# Patient Record
Sex: Female | Born: 1970 | Race: Black or African American | Hispanic: No | Marital: Single | Smoking: Never smoker
Health system: Southern US, Community
[De-identification: ages and names within clinical notes are randomized; demographics above are authoritative.]

## PROBLEM LIST (undated history)

## (undated) DIAGNOSIS — F419 Anxiety disorder, unspecified: Secondary | ICD-10-CM

## (undated) DIAGNOSIS — D649 Anemia, unspecified: Secondary | ICD-10-CM

## (undated) DIAGNOSIS — D569 Thalassemia, unspecified: Secondary | ICD-10-CM

## (undated) HISTORY — DX: Anxiety disorder, unspecified: F41.9

## (undated) HISTORY — DX: Thalassemia, unspecified: D56.9

## (undated) HISTORY — DX: Anemia, unspecified: D64.9

## (undated) HISTORY — PX: NO PAST SURGERIES: SHX2092

---

## 2000-03-28 ENCOUNTER — Emergency Department (HOSPITAL_COMMUNITY): Admission: EM | Admit: 2000-03-28 | Discharge: 2000-03-28 | Payer: Self-pay | Admitting: Emergency Medicine

## 2000-03-28 ENCOUNTER — Encounter: Payer: Self-pay | Admitting: Emergency Medicine

## 2001-07-05 ENCOUNTER — Other Ambulatory Visit: Admission: RE | Admit: 2001-07-05 | Discharge: 2001-07-05 | Payer: Self-pay | Admitting: Family Medicine

## 2012-05-29 ENCOUNTER — Other Ambulatory Visit: Payer: Self-pay | Admitting: Internal Medicine

## 2012-05-29 DIAGNOSIS — Z1231 Encounter for screening mammogram for malignant neoplasm of breast: Secondary | ICD-10-CM

## 2012-06-01 ENCOUNTER — Ambulatory Visit
Admission: RE | Admit: 2012-06-01 | Discharge: 2012-06-01 | Disposition: A | Discharge status: Home / Self Care | Payer: BC Managed Care – PPO | Source: Ambulatory Visit | Attending: Internal Medicine | Admitting: Internal Medicine

## 2012-06-01 DIAGNOSIS — Z1231 Encounter for screening mammogram for malignant neoplasm of breast: Secondary | ICD-10-CM

## 2013-08-21 ENCOUNTER — Other Ambulatory Visit: Payer: Self-pay

## 2013-08-21 DIAGNOSIS — Z1231 Encounter for screening mammogram for malignant neoplasm of breast: Secondary | ICD-10-CM

## 2013-09-10 ENCOUNTER — Encounter (INDEPENDENT_AMBULATORY_CARE_PROVIDER_SITE_OTHER): Payer: Self-pay

## 2013-09-10 ENCOUNTER — Ambulatory Visit
Admission: RE | Admit: 2013-09-10 | Discharge: 2013-09-10 | Disposition: A | Discharge status: Home / Self Care | Payer: BC Managed Care – PPO | Source: Ambulatory Visit

## 2013-09-10 DIAGNOSIS — Z1231 Encounter for screening mammogram for malignant neoplasm of breast: Secondary | ICD-10-CM

## 2014-07-17 ENCOUNTER — Other Ambulatory Visit: Payer: Self-pay

## 2014-07-17 DIAGNOSIS — Z1231 Encounter for screening mammogram for malignant neoplasm of breast: Secondary | ICD-10-CM

## 2014-09-17 ENCOUNTER — Ambulatory Visit
Admission: RE | Admit: 2014-09-17 | Discharge: 2014-09-17 | Disposition: A | Discharge status: Home / Self Care | Payer: BLUE CROSS/BLUE SHIELD | Source: Ambulatory Visit

## 2014-09-17 ENCOUNTER — Ambulatory Visit (INDEPENDENT_AMBULATORY_CARE_PROVIDER_SITE_OTHER): Payer: BLUE CROSS/BLUE SHIELD | Admitting: Physician Assistant

## 2014-09-17 VITALS — BP 132/84 | HR 71 | Temp 98.7°F | Resp 16 | Ht 67.25 in | Wt 205.6 lb

## 2014-09-17 DIAGNOSIS — R0781 Pleurodynia: Secondary | ICD-10-CM | POA: Diagnosis not present

## 2014-09-17 DIAGNOSIS — Z1231 Encounter for screening mammogram for malignant neoplasm of breast: Secondary | ICD-10-CM

## 2014-09-17 LAB — HM MAMMOGRAPHY: HM Mammogram: NORMAL (ref 0–4)

## 2014-09-17 MED ORDER — MELOXICAM 7.5 MG PO TABS: 7.5000 mg | ORAL_TABLET | Freq: Every day | ORAL | Status: DC

## 2014-09-17 MED ORDER — CYCLOBENZAPRINE HCL 5 MG PO TABS
5.0000 mg | ORAL_TABLET | Freq: Three times a day (TID) | ORAL | Status: DC | PRN
Start: 2014-09-17 — End: 2016-08-10

## 2014-09-17 NOTE — Patient Instructions (Signed)
I think the pain and irritation along your right side is due to costochondritis. Please take the mobic daily for 3 weeks. Do not take other NSAIDs with this medication. Please take the flexeril every 8 hours as needed. Remember this may make you drowsy.  Heat before activities, ice afterward. Please come back to see us in 3 weeks for further workup and possible xray if your symptoms are not improved.   Costochondritis Costochondritis, sometimes called Tietze syndrome, is a swelling and irritation (inflammation) of the tissue (cartilage) that connects your ribs with your breastbone (sternum). It causes pain in the chest and rib area. Costochondritis usually goes away on its own over time. It can take up to 6 weeks or longer to get better, especially if you are unable to limit your activities. CAUSES  Some cases of costochondritis have no known cause. Possible causes include:  Injury (trauma).  Exercise or activity such as lifting.  Severe coughing. SIGNS AND SYMPTOMS  Pain and tenderness in the chest and rib area.  Pain that gets worse when coughing or taking deep breaths.  Pain that gets worse with specific movements. DIAGNOSIS  Your health care provider will do a physical exam and ask about your symptoms. Chest X-rays or other tests may be done to rule out other problems. TREATMENT  Costochondritis usually goes away on its own over time. Your health care provider may prescribe medicine to help relieve pain. HOME CARE INSTRUCTIONS   Avoid exhausting physical activity. Try not to strain your ribs during normal activity. This would include any activities using chest, abdominal, and side muscles, especially if heavy weights are used.  Apply ice to the affected area for the first 2 days after the pain begins.  Put ice in a plastic bag.  Place a towel between your skin and the bag.  Leave the ice on for 20 minutes, 2-3 times a day.  Only take over-the-counter or prescription  medicines as directed by your health care provider. SEEK MEDICAL CARE IF:  You have redness or swelling at the rib joints. These are signs of infection.  Your pain does not go away despite rest or medicine. SEEK IMMEDIATE MEDICAL CARE IF:   Your pain increases or you are very uncomfortable.  You have shortness of breath or difficulty breathing.  You cough up blood.  You have worse chest pains, sweating, or vomiting.  You have a fever or persistent symptoms for more than 2-3 days.  You have a fever and your symptoms suddenly get worse. MAKE SURE YOU:   Understand these instructions.  Will watch your condition.  Will get help right away if you are not doing well or get worse. Document Released: 01/19/2005 Document Revised: 01/30/2013 Document Reviewed: 11/13/2012 [facility] Patient Information 2015 [employer] [place]. This information is not intended to replace advice given to you by your health care provider. Make sure you discuss any questions you have with your health care provider.

## 2014-09-17 NOTE — Progress Notes (Signed)
   Subjective:    Patient ID: Charlene Evans, female    DOB: 1970-11-11, 44 y.o.   MRN: [id number]  Chief Complaint  Patient presents with  . Right side pain    on and off since december    There are no active problems to display for this patient.  Prior to Admission medications   Medication Sig Start Date End Date Taking? Authorizing Provider  ibuprofen (ADVIL,MOTRIN) 100 MG tablet Take 100 mg by mouth every 6 (six) hours as needed for fever.   Yes Historical Provider, MD  Norgestimate-Ethinyl Estradiol Triphasic 0.18/0.215/0.25 MG-35 MCG tablet Take 1 tablet by mouth daily.   Yes Historical Provider, MD  cyclobenzaprine (FLEXERIL) 5 MG tablet Take 1 tablet (5 mg total) by mouth 3 (three) times daily as needed for muscle spasms. 09/17/14   Raelyn Ensignodd Cornellius Kropp, PA  meloxicam (MOBIC) 7.5 MG tablet Take 1 tablet (7.5 mg total) by mouth daily. 09/17/14   Raelyn Ensignodd Kaylee Wombles, PA   Medications, allergies, past medical history, surgical history, family history, social history and problem list reviewed and updated.  HPI  [age] yof presents with right sided pain past 5 months.  Has had intermittent right sided pain between ribs since December. Saw her pcp at that for CPE and was told it was likely a strain. Pt states she had complete lab work at that time which was all normal. Area is tender when she pushes on it. She does feel occasional pain in the area even at rest, maybe 1-2 x per month. This resolves after few hours.   She works two jobs, one as a Horticulturist, [employer] involving pt lifting. She thinks she may be irritating the area at work. Not taking anything for the pain.   Denies fevers, chills, cough, cp, sob, abd pain, left sided pain, flank pain.   Review of Systems See HPI.     Objective:   Physical Exam  Constitutional: She is oriented to person, place, and time. She appears well-developed and well-nourished.  Non-toxic appearance. She does not have a sickly appearance. She does not appear ill. No distress.  BP  132/84 mmHg  Pulse 71  Temp(Src) 98.7 F (37.1 C) (Oral)  Resp 16  Ht 5' 7.25" (1.708 m)  Wt 205 lb 9.6 oz (93.26 kg)  BMI 31.97 kg/m2  SpO2 97%  LMP 09/10/2014   Cardiovascular: Normal rate, regular rhythm and normal heart sounds.   Pulmonary/Chest: Effort normal and breath sounds normal. She exhibits tenderness.  TTP between 6th and 7th ribs right side. Slight spasm noted. No bony tenderness. No ruq pain. Neg cva tenderness.   Musculoskeletal:       Back:  Neurological: She is alert and oriented to person, place, and time.      Assessment & Plan:   [age] yof presents with right sided pain past 5 months.  Rib pain on right side - Plan: meloxicam (MOBIC) 7.5 MG tablet, cyclobenzaprine (FLEXERIL) 5 MG tablet --ttp between 6th, 7th ribs with small spasm noted in area --likely costochondritis --flexeril, mobic, heat before activity, ice after activity, light rom once feeling better --attempt to avoid heavy pt lifting for next 1-2 weeks if possible --rtc 3 wks if sx persist for further workup, possible imaging  Donnajean Lopesodd M. Levern Pitter, PA-C Physician Assistant-Certified [facility]  09/17/2014 10:49 AM

## 2015-08-20 ENCOUNTER — Other Ambulatory Visit: Payer: Self-pay

## 2015-08-20 DIAGNOSIS — Z1231 Encounter for screening mammogram for malignant neoplasm of breast: Secondary | ICD-10-CM

## 2015-09-18 ENCOUNTER — Ambulatory Visit: Payer: BLUE CROSS/BLUE SHIELD

## 2016-04-27 ENCOUNTER — Ambulatory Visit (INDEPENDENT_AMBULATORY_CARE_PROVIDER_SITE_OTHER): Payer: BLUE CROSS/BLUE SHIELD | Admitting: Family Medicine

## 2016-04-27 ENCOUNTER — Encounter: Payer: Self-pay | Admitting: Family Medicine

## 2016-04-27 VITALS — BP 128/76 | HR 80 | Temp 99.0°F | Resp 18 | Ht 67.25 in | Wt 208.0 lb

## 2016-04-27 DIAGNOSIS — R51 Headache with orthostatic component, not elsewhere classified: Secondary | ICD-10-CM

## 2016-04-27 NOTE — Progress Notes (Signed)
Patient ID: Renne Musca, female    DOB: 1970-06-10, 46 y.o.   MRN: [id number]  PCP: No primary care provider on file.  Chief Complaint  Patient presents with  . Headache    2 months    Subjective:  HPI  46 year old female presents for evaluation of headaches x 2 months. Bending over or stretching of creates "pressure in head". Reports going to bed and awakening with bilateral headache. Doesn't routinely monitor blood pressure and reports a family hx including mother and father with HTN .Spots in eyes for several years. 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 Hx of migraines. Her last migraine 5 years ago. Denies memory loss, gait instability, or slurring of speech. Reports that she doesn't like taking medication and was previously dx with anemia.  Social History   Social History  . Marital status: Married    Spouse name: N/A  . Number of children: N/A  . Years of education: N/A   Occupational History  . Not on file.   Social History Main Topics  . Smoking status: Never Smoker  . Smokeless tobacco: Never Used  . Alcohol use No  . Drug use: No  . Sexual activity: Not on file   Other Topics Concern  . Not on file   Social History Narrative  . No narrative on file    Family History  Problem Relation Age of Onset  . Diabetes Mother   . Hypertension Mother   . Cancer Father   . Cancer Maternal Grandmother   . Diabetes Maternal Grandmother   . Cancer Maternal Grandfather   . Diabetes Maternal Grandfather   . Hypertension Maternal Grandfather    Review of Systems   See HPI  There are no active problems to display for this patient.   No Known Allergies  Prior to Admission medications   Medication Sig Start Date End Date Taking? Authorizing Provider  cyclobenzaprine (FLEXERIL) 5 MG tablet Take 1 tablet (5 mg total) by mouth 3 (three) times daily as needed for muscle spasms. 09/17/14   Raelyn Ensign, PA  ibuprofen  (ADVIL,MOTRIN) 100 MG tablet Take 100 mg by mouth every 6 (six) hours as needed for fever.    Historical Provider, MD  meloxicam (MOBIC) 7.5 MG tablet Take 1 tablet (7.5 mg total) by mouth daily. 09/17/14   Raelyn Ensign, PA  Norgestimate-Ethinyl Estradiol Triphasic 0.18/0.215/0.25 MG-35 MCG tablet Take 1 tablet by mouth daily.    Historical Provider, MD    Past Medical, Surgical Family and Social History reviewed and updated.    Objective:   Today's Vitals   04/27/16 1130  BP: 128/76  Pulse: 80  Resp: 18  Temp: 99 F (37.2 C)  TempSrc: Oral  SpO2: 100%  Weight: 208 lb (94.3 kg)  Height: 5' 7.25" (1.708 m)   Wt Readings from Last 3 Encounters:  04/27/16 208 lb (94.3 kg)  09/17/14 205 lb 9.6 oz (93.3 kg)   Physical Exam  Constitutional: She is oriented to person, place, and time. She appears well-developed and well-nourished.  HENT:  Head: Normocephalic and atraumatic.  Eyes: Pupils are equal, round, and reactive to light.  Neck: Normal range of motion. Neck supple.  Cardiovascular: Normal rate, regular rhythm, normal heart sounds and intact distal pulses.   Pulmonary/Chest: Effort normal and breath sounds normal.  Neurological: She is alert and oriented to person, place, and time. She has normal strength. No cranial nerve deficit or sensory deficit. She  displays a negative Romberg sign. GCS eye subscore is 4. GCS verbal subscore is 5. GCS motor subscore is 6.  Skin: Skin is warm and dry.  Psychiatric: She has a normal mood and affect. Her behavior is normal. Judgment and thought content normal.       Assessment & Plan:  1. Postural headache, neurological exam unremarkable. Unable to reproduce postural development of headache.  - CBC with Differential/Platelet.  - Comprehensive metabolic panel - TSH - VITAMIN D 25 Hydroxy (Vit-D Deficiency, Fractures) - Vitamin B12 - FSH/LH  Will release your lab results to MyChart. If all labs are normal, will refer patient to  neurology headache clinic.  Godfrey PickKimberly S. Tiburcio PeaHarris, MSN, FNP-C [facility]

## 2016-04-27 NOTE — Patient Instructions (Addendum)
You will be notified of your lab results.  If all labs are normal, I will refer you to neurology "headache clinic"  for further evaluation of of symptoms.   IF you received an x-ray today, you will receive an invoice from [facility]. Please contact [facility] at [phone] with questions or concerns regarding your invoice.   IF you received labwork today, you will receive an invoice from [facility]. Please contact [facility] at [phone] with questions or concerns regarding your invoice.   Our billing staff will not be able to assist you with questions regarding bills from these companies.  You will be contacted with the lab results as soon as they are available. The fastest way to get your results is to activate your My Chart account. Instructions are located on the last page of this paperwork. If you have not heard from [place] regarding the results in 2 weeks, please contact this office.      General Headache Without Cause Introduction A headache is pain or discomfort felt around the head or neck area. There are many causes and types of headaches. In some cases, the cause may not be found. Follow these instructions at home: Managing pain  Take over-the-counter and prescription medicines only as told by your doctor.  Lie down in a dark, quiet room when you have a headache.  If directed, apply ice to the head and neck area:  Put ice in a plastic bag.  Place a towel between your skin and the bag.  Leave the ice on for 20 minutes, 2-3 times per day.  Use a heating pad or hot shower to apply heat to the head and neck area as told by your doctor.  Keep lights dim if bright lights bother you or make your headaches worse. Eating and drinking  Eat meals on a regular schedule.  Lessen how much alcohol you drink.  Lessen how much caffeine you drink, or stop drinking caffeine. General instructions  Keep all follow-up visits as told by your doctor. This is  important.  Keep a journal to find out if certain things bring on headaches. For example, write down:  What you eat and drink.  How much sleep you get.  Any change to your diet or medicines.  Relax by getting a massage or doing other relaxing activities.  Lessen stress.  Sit up straight. Do not tighten (tense) your muscles.  Do not use tobacco products. This includes cigarettes, chewing tobacco, or e-cigarettes. If you need help quitting, ask your doctor.  Exercise regularly as told by your doctor.  Get enough sleep. This often means 7-9 hours of sleep. Contact a doctor if:  Your symptoms are not helped by medicine.  You have a headache that feels different than the other headaches.  You feel sick to your stomach (nauseous) or you throw up (vomit).  You have a fever. Get help right away if:  Your headache becomes really bad.  You keep throwing up.  You have a stiff neck.  You have trouble seeing.  You have trouble speaking.  You have pain in the eye or ear.  Your muscles are weak or you lose muscle control.  You lose your balance or have trouble walking.  You feel like you will pass out (faint) or you pass out.  You have confusion. This information is not intended to replace advice given to you by your health care provider. Make sure you discuss any questions you have with your health care provider.  Document Released: 01/19/2008 Document Revised: 09/17/2015 Document Reviewed: 08/04/2014  2017 Elsevier

## 2016-04-28 LAB — COMPREHENSIVE METABOLIC PANEL
ALT: 6 IU/L (ref 0–32)
AST: 9 IU/L (ref 0–40)
Albumin/Globulin Ratio: 1.2 (ref 1.2–2.2)
Albumin: 4 g/dL (ref 3.5–5.5)
Alkaline Phosphatase: 66 IU/L (ref 39–117)
BUN/Creatinine Ratio: 18 (ref 9–23)
BUN: 13 mg/dL (ref 6–24)
Bilirubin Total: <0.2 mg/dL (ref 0.0–1.2)
CO2: 22 mmol/L (ref 18–29)
Calcium: 9.1 mg/dL (ref 8.7–10.2)
Chloride: 101 mmol/L (ref 96–106)
Creatinine, Ser: 0.72 mg/dL (ref 0.57–1.00)
GFR calc Af Amer: 116 mL/min/{1.73_m2} (ref >59)
GFR calc non Af Amer: 101 mL/min/{1.73_m2} (ref >59)
Globulin, Total: 3.4 g/dL (ref 1.5–4.5)
Glucose: 88 mg/dL (ref 65–99)
Potassium: 4.5 mmol/L (ref 3.5–5.2)
Sodium: 140 mmol/L (ref 134–144)
Total Protein: 7.4 g/dL (ref 6.0–8.5)

## 2016-04-28 LAB — CBC WITH DIFFERENTIAL/PLATELET
Basophils Absolute: 0 10*3/uL (ref 0.0–0.2)
Basos: 0 %
EOS (ABSOLUTE): 0.1 10*3/uL (ref 0.0–0.4)
Eos: 2 %
Hematocrit: 33.8 % — ABNORMAL LOW (ref 34.0–46.6)
Hemoglobin: 10.9 g/dL — ABNORMAL LOW (ref 11.1–15.9)
Immature Grans (Abs): 0 10*3/uL (ref 0.0–0.1)
Immature Granulocytes: 0 %
Lymphocytes Absolute: 1.9 10*3/uL (ref 0.7–3.1)
Lymphs: 22 %
MCH: 20 pg — ABNORMAL LOW (ref 26.6–33.0)
MCHC: 32.2 g/dL (ref 31.5–35.7)
MCV: 62 fL — ABNORMAL LOW (ref 79–97)
Monocytes Absolute: 0.6 10*3/uL (ref 0.1–0.9)
Monocytes: 7 %
Neutrophils Absolute: 5.9 10*3/uL (ref 1.4–7.0)
Neutrophils: 69 %
Platelets: 362 10*3/uL (ref 150–379)
RBC: 5.44 x10E6/uL — ABNORMAL HIGH (ref 3.77–5.28)
RDW: 19.7 % — ABNORMAL HIGH (ref 12.3–15.4)
WBC: 8.5 10*3/uL (ref 3.4–10.8)

## 2016-04-28 LAB — FSH/LH
FSH: 2.1 m[IU]/mL
LH: 0.9 m[IU]/mL

## 2016-04-28 LAB — VITAMIN D 25 HYDROXY (VIT D DEFICIENCY, FRACTURES): Vit D, 25-Hydroxy: 14.2 ng/mL — ABNORMAL LOW (ref 30.0–100.0)

## 2016-04-28 LAB — VITAMIN B12: Vitamin B-12: 516 pg/mL (ref 232–1245)

## 2016-04-28 LAB — TSH: TSH: 1.53 u[IU]/mL (ref 0.450–4.500)

## 2016-05-02 MED ORDER — VITAMIN D (ERGOCALCIFEROL) 1.25 MG (50000 UNIT) PO CAPS: 50000.0000 [IU] | ORAL_CAPSULE | ORAL | 1 refills | Status: DC

## 2016-05-02 MED ORDER — IRON 325 (65 FE) MG PO TABS: 1.0000 | ORAL_TABLET | Freq: Every day | ORAL | 3 refills | Status: DC

## 2016-05-02 NOTE — Addendum Note (Signed)
Addended by: Bing NeighborsHARRIS, Yesennia Hirota S on: 05/02/2016 02:54 PM   Modules accepted: Orders

## 2016-08-10 ENCOUNTER — Ambulatory Visit (INDEPENDENT_AMBULATORY_CARE_PROVIDER_SITE_OTHER): Payer: BLUE CROSS/BLUE SHIELD | Admitting: Physician Assistant

## 2016-08-10 VITALS — BP 138/82 | HR 77 | Temp 98.6°F | Ht 67.72 in | Wt 208.4 lb

## 2016-08-10 DIAGNOSIS — J3489 Other specified disorders of nose and nasal sinuses: Secondary | ICD-10-CM

## 2016-08-10 DIAGNOSIS — N926 Irregular menstruation, unspecified: Secondary | ICD-10-CM

## 2016-08-10 DIAGNOSIS — R0981 Nasal congestion: Secondary | ICD-10-CM

## 2016-08-10 DIAGNOSIS — J321 Chronic frontal sinusitis: Secondary | ICD-10-CM

## 2016-08-10 LAB — POCT URINE PREGNANCY: Preg Test, Ur: NEGATIVE

## 2016-08-10 MED ORDER — AMOXICILLIN 875 MG PO TABS: 875.0000 mg | ORAL_TABLET | Freq: Two times a day (BID) | ORAL | 0 refills | Status: AC

## 2016-08-10 NOTE — Patient Instructions (Addendum)
I am treating you today for a sinus infection. Please take ENTIRE COURSE of antibiotics, even if you start to feel better.  Stay well hydrated - drink 1-3 liters of water/day.   Please start taking an allergy pill daily. This may help with your runny nose. Flonase may also help.   Allergic Rhinitis Allergic rhinitis is when the mucous membranes in the nose respond to allergens. Allergens are particles in the air that cause your body to have an allergic reaction. This causes you to release allergic antibodies. Through a chain of events, these eventually cause you to release histamine into the blood stream. Although meant to protect the body, it is this release of histamine that causes your discomfort, such as frequent sneezing, congestion, and an itchy, runny nose. What are the causes? Seasonal allergic rhinitis (hay fever) is caused by pollen allergens that may come from grasses, trees, and weeds. Year-round allergic rhinitis (perennial allergic rhinitis) is caused by allergens such as house dust mites, pet dander, and mold spores. What are the signs or symptoms?  Nasal stuffiness (congestion).  Itchy, runny nose with sneezing and tearing of the eyes. How is this diagnosed? Your health care provider can help you determine the allergen or allergens that trigger your symptoms. If you and your health care provider are unable to determine the allergen, skin or blood testing may be used. Your health care provider will diagnose your condition after taking your health history and performing a physical exam. Your health care provider may assess you for other related conditions, such as asthma, pink eye, or an ear infection. How is this treated? Allergic rhinitis does not have a cure, but it can be controlled by:  Medicines that block allergy symptoms. These may include allergy shots, nasal sprays, and oral antihistamines.  Avoiding the allergen. Hay fever may often be treated with antihistamines in  pill or nasal spray forms. Antihistamines block the effects of histamine. There are over-the-counter medicines that may help with nasal congestion and swelling around the eyes. Check with your health care provider before taking or giving this medicine. If avoiding the allergen or the medicine prescribed do not work, there are many new medicines your health care provider can prescribe. Stronger medicine may be used if initial measures are ineffective. Desensitizing injections can be used if medicine and avoidance does not work. Desensitization is when a patient is given ongoing shots until the body becomes less sensitive to the allergen. Make sure you follow up with your health care provider if problems continue. Follow these instructions at home: It is not possible to completely avoid allergens, but you can reduce your symptoms by taking steps to limit your exposure to them. It helps to know exactly what you are allergic to so that you can avoid your specific triggers. Contact a health care provider if:  You have a fever.  You develop a cough that does not stop easily (persistent).  You have shortness of breath.  You start wheezing.  Symptoms interfere with normal daily activities. This information is not intended to replace advice given to you by your health care provider. Make sure you discuss any questions you have with your health care provider. Document Released: 01/04/2001 Document Revised: 12/11/2015 Document Reviewed: 12/17/2012 Elsevier Interactive Patient Education  2017 [employer].   IF you received an x-ray today, you will receive an invoice from [facility]. Please contact [facility] at [phone] with questions or concerns regarding your invoice.   IF you received labwork  today, you will receive an invoice from [place]. Please contact [facility] at [phone] with questions or concerns regarding your invoice.   Our billing staff will not be able to  assist you with questions regarding bills from these companies.  You will be contacted with the lab results as soon as they are available. The fastest way to get your results is to activate your My Chart account. Instructions are located on the last page of this paperwork. If you have not heard from [place] regarding the results in 2 weeks, please contact this office.

## 2016-08-10 NOTE — Progress Notes (Signed)
Charlene Evans  MRN: [id number] DOB: Sep 07, 1970  PCP: Gwynneth Aliment, MD  Subjective:  Pt is a 46 year old female who presents to clinic for sinus pain. She has been feeling congested for the past 3-4 weeks.  She has tried OTC sinus and cold medication - not helping. She gets these symptoms a few times a year, but they never get this bad and usually get better with OTC medications.  No known h/o seasonal allergies, however she has a "runny nose constantly."   Would like pregnancy test today - LMP "I can't remember". Sexually active. Uses birth control but "I kinda missed a few pills". Denies abdominal pain, n/v, back pain, fever, chills.   Review of Systems  Constitutional: Negative for chills, diaphoresis, fatigue and fever.  HENT: Positive for congestion, ear pain, postnasal drip, rhinorrhea, sinus pain, sinus pressure and sneezing. Negative for ear discharge and sore throat.   Respiratory: Negative for cough, chest tightness, shortness of breath and wheezing.   Cardiovascular: Negative for chest pain and palpitations.  Gastrointestinal: Negative for abdominal pain, diarrhea, nausea and vomiting.  Genitourinary: Positive for menstrual problem (missed period). Negative for pelvic pain, vaginal bleeding, vaginal discharge and vaginal pain.  Neurological: Positive for headaches. Negative for weakness and light-headedness.    There are no active problems to display for this patient.   Current Outpatient Prescriptions on File Prior to Visit  Medication Sig Dispense Refill  . ibuprofen (ADVIL,MOTRIN) 100 MG tablet Take 100 mg by mouth every 6 (six) hours as needed for fever.    . cyclobenzaprine (FLEXERIL) 5 MG tablet Take 1 tablet (5 mg total) by mouth 3 (three) times daily as needed for muscle spasms. (Patient not taking: Reported on 08/10/2016) 20 tablet 0  . Ferrous Sulfate (IRON) 325 (65 Fe) MG TABS Take 1 tablet by mouth daily. (Patient not taking: Reported on 08/10/2016) 90 each 3   . meloxicam (MOBIC) 7.5 MG tablet Take 1 tablet (7.5 mg total) by mouth daily. (Patient not taking: Reported on 08/10/2016) 21 tablet 0  . Norgestimate-Ethinyl Estradiol Triphasic 0.18/0.215/0.25 MG-35 MCG tablet Take 1 tablet by mouth daily.    . Vitamin D, Ergocalciferol, (DRISDOL) 50000 units CAPS capsule Take 1 capsule (50,000 Units total) by mouth every 7 (seven) days. 30 capsule 1   No current facility-administered medications on file prior to visit.     No Known Allergies   Objective:  BP 138/82 (BP Location: Right Arm, Patient Position: Sitting, Cuff Size: Large)   Pulse 77   Temp 98.6 F (37 C) (Oral)   Ht 5' 7.72" (1.72 m)   Wt 208 lb 6.4 oz (94.5 kg)   SpO2 99%   BMI 31.95 kg/m   Physical Exam  Constitutional: She is oriented to person, place, and time and well-developed, well-nourished, and in no distress. No distress.  HENT:  Left Ear: Tympanic membrane is bulging. A middle ear effusion is present.  Nose: Mucosal edema and rhinorrhea present. Right sinus exhibits maxillary sinus tenderness. Right sinus exhibits no frontal sinus tenderness. Left sinus exhibits maxillary sinus tenderness. Left sinus exhibits no frontal sinus tenderness.  Mouth/Throat: Oropharynx is clear and moist and mucous membranes are normal.  Cardiovascular: Normal rate, regular rhythm and normal heart sounds.   Neurological: She is alert and oriented to person, place, and time. GCS score is 15.  Skin: Skin is warm and dry.  Psychiatric: Mood, memory, affect and judgment normal.  Vitals reviewed.   Results for orders placed  or performed in visit on 08/10/16  POCT urine pregnancy  Result Value Ref Range   Preg Test, Ur Negative Negative    Assessment and Plan :  1. Frontal sinusitis, unspecified chronicity 2. Sinus pain 3. Nasal congestion - amoxicillin (AMOXIL) 875 MG tablet; Take 1 tablet (875 mg total) by mouth 2 (two) times daily.  Dispense: 14 tablet; Refill: 0 - Supportive care  encouraged - stay well hydrated. RTC in 5-7 days if no improvement. Advised pt start daily regimen of allergy medication - she agrees with plan.   4. Missed period - POCT urine pregnancy - Negative pregnancy test.   Marco Collie, PA-C  [facility] at [facility] 08/10/2016 10:31 AM

## 2016-12-29 ENCOUNTER — Encounter: Payer: Self-pay | Admitting: Podiatry

## 2016-12-29 ENCOUNTER — Ambulatory Visit (INDEPENDENT_AMBULATORY_CARE_PROVIDER_SITE_OTHER): Payer: BLUE CROSS/BLUE SHIELD

## 2016-12-29 ENCOUNTER — Ambulatory Visit (INDEPENDENT_AMBULATORY_CARE_PROVIDER_SITE_OTHER): Payer: BLUE CROSS/BLUE SHIELD | Admitting: Podiatry

## 2016-12-29 VITALS — BP 137/84 | HR 82

## 2016-12-29 DIAGNOSIS — M7661 Achilles tendinitis, right leg: Secondary | ICD-10-CM

## 2016-12-29 DIAGNOSIS — B351 Tinea unguium: Secondary | ICD-10-CM

## 2016-12-29 DIAGNOSIS — M722 Plantar fascial fibromatosis: Secondary | ICD-10-CM

## 2016-12-29 DIAGNOSIS — M7662 Achilles tendinitis, left leg: Secondary | ICD-10-CM

## 2016-12-29 MED ORDER — TERBINAFINE HCL 250 MG PO TABS: 250.0000 mg | ORAL_TABLET | Freq: Every day | ORAL | 0 refills | Status: DC

## 2016-12-29 NOTE — Progress Notes (Signed)
   Subjective:    Patient ID: Charlene Evans, female    DOB: 08-09-1970, 46 y.o.   MRN: [id number]  HPI  Chief Complaint  Patient presents with  . Foot Pain    B/L back of heels, painful. right worse than left  . Nail Problem    Left, Hallux and 2nd toe - discolored.  . Callouses    B/L forefoot.    46 y.o. female presents today for the above complaints. Reports bilateral back of the heel pain that has been present off and on for 8 years. Reports R hurts more than L and the pain in her R leg radiates up her leg. Pain described as sharp. Reports nail discoloration and thickening of her nails. Denies prior treatments.  Review of Systems  HENT: Positive for sinus pressure.   Eyes: Positive for itching.  Musculoskeletal: Positive for arthralgias, back pain and myalgias.  All other systems reviewed and are negative.     Objective:   Physical Exam Vitals:   12/29/16 1550  BP: 137/84  Pulse: 82   General AA&O x3. Normal mood and affect.  Vascular Dorsalis pedis and posterior tibial pulses  present 2+ bilaterally  Capillary refill normal to all digits. Pedal hair growth normal.  Neurologic Epicritic sensation grossly present bilaterally.  Dermatologic No open lesions. Interspaces clear of maceration. Nails well groomed and normal in appearance. Nails x 6 with black/brown discoloration, thickening, crumbly texture bilat.  Orthopedic: MMT 5/5 in dorsiflexion, plantarflexion, inversion, and eversion bilaterally. Tender to palpation at the posterior calcaneus right. No pain with calcaneal squeeze right. Ankle ROM diminished range of motion bilaterally. Silfverskiold Test: positive bilaterally.   Radiographs: Taken and reviewed. No acute fractures. No evidence of calcaneal stress fracture. Posterior and plantar calcaneal spurring noted.     Assessment & Plan:  Achilles Tendonitis, left -XR reviewed as above. -Educated on stretching and icing of the affected limb. -Night splint  dispensed for manual stretching of the Achilles tendon. -Discussed possible gastrocnemius recession in the future to alleviate stress on the Achilles tendon.  Onychomycosis -Educated on etiology of nail fungus. -Baseline liver function studies ordered. Will d/c terbinafine if elevated during therapy. -eRx for oral terbinafine #30. Educated on risks and benefits of the medication.  Return in about 4 weeks (around 01/26/2017).

## 2017-01-04 ENCOUNTER — Encounter: Payer: Self-pay | Admitting: Physician Assistant

## 2017-01-04 ENCOUNTER — Ambulatory Visit (INDEPENDENT_AMBULATORY_CARE_PROVIDER_SITE_OTHER): Payer: BLUE CROSS/BLUE SHIELD | Admitting: Physician Assistant

## 2017-01-04 VITALS — BP 128/84 | HR 74 | Resp 16 | Ht 67.72 in | Wt 204.8 lb

## 2017-01-04 DIAGNOSIS — G44011 Episodic cluster headache, intractable: Secondary | ICD-10-CM

## 2017-01-04 DIAGNOSIS — R112 Nausea with vomiting, unspecified: Secondary | ICD-10-CM | POA: Diagnosis not present

## 2017-01-04 LAB — POCT CBC
Granulocyte percent: 70.7 %G (ref 37–80)
HCT, POC: 38.9 % (ref 37.7–47.9)
Hemoglobin: 12 g/dL — AB (ref 12.2–16.2)
Lymph, poc: 1.9 (ref 0.6–3.4)
MCH, POC: 20.1 pg — AB (ref 27–31.2)
MCHC: 30.9 g/dL — AB (ref 31.8–35.4)
MCV: 65 fL — AB (ref 80–97)
MID (cbc): 0.3 (ref 0–0.9)
MPV: 8.7 fL (ref 0–99.8)
POC Granulocyte: 5.1 (ref 2–6.9)
POC LYMPH PERCENT: 25.7 %L (ref 10–50)
POC MID %: 3.6 %M (ref 0–12)
Platelet Count, POC: 401 10*3/uL (ref 142–424)
RBC: 5.99 M/uL — AB (ref 4.04–5.48)
RDW, POC: 15.8 %
WBC: 7.2 10*3/uL (ref 4.6–10.2)

## 2017-01-04 LAB — POCT URINE PREGNANCY: Preg Test, Ur: NEGATIVE

## 2017-01-04 NOTE — Patient Instructions (Signed)
You will be contact to schedule your CT.  I will call with the results and plan.  Please let me know if you have any questions.   Thank you for coming in today. I hope you feel we met your needs.  Feel free to call PCP if you have any questions or further requests.  Please consider signing up for MyChart if you do not already have it, as this is a great way to communicate with me.  Best,  [employer], PA-C

## 2017-01-04 NOTE — Progress Notes (Signed)
Charlene Evans  MRN: [id number] DOB: 1971/04/03  PCP: Dorothyann Peng, MD  Subjective:  Pt is a pleasant 46 year old female PMH headaches, anemia presents to clinic for headache x 2 days. Two episodes of vomiting yesterday.   Headache is not as intense today. Located in the front of her head "like a little man is in my head kicking my brain". "20"/10 headache yesterday - she had to pull over while driving.  She took 1,000 mg ibuprofen yesterday, didn't help. 5/10 pain right now.  Denies dizziness, fever, chills, night sweats, weight loss, visual changes.     Pt has h/o allergies, sinus problems and headaches. HA normally controlled with ibuprofen.   FHx - "lots of cancer".  MGM pancreatic cancer and MGF -unknown; M Aunt kidney cancer; PGM and PGF unknown cancer; paternal uncle - leukemia.   Of note she endorses "head fullness" x 6 months whenever she bends forward or raises her arms to stretch.   Review of Systems  Constitutional: Negative for appetite change, chills, fatigue, fever and unexpected weight change.  Eyes: Negative for visual disturbance.  Respiratory: Negative for cough, chest tightness and shortness of breath.   Cardiovascular: Negative for chest pain and palpitations.  Gastrointestinal: Positive for nausea and vomiting.  Musculoskeletal: Negative for neck pain.  Neurological: Positive for headaches. Negative for dizziness, syncope, weakness and light-headedness.  Psychiatric/Behavioral: Negative for behavioral problems, confusion and sleep disturbance. The patient is not nervous/anxious.     There are no active problems to display for this patient.   Current Outpatient Prescriptions on File Prior to Visit  Medication Sig Dispense Refill  . ibuprofen (ADVIL,MOTRIN) 100 MG tablet Take 100 mg by mouth every 6 (six) hours as needed for fever.    . Vitamin D, Ergocalciferol, (DRISDOL) 50000 units CAPS capsule Take 1 capsule (50,000 Units total) by mouth every 7 (seven)  days. 30 capsule 1  . terbinafine (LAMISIL) 250 MG tablet Take 1 tablet (250 mg total) by mouth daily. (Patient not taking: Reported on 01/04/2017) 90 tablet 0   No current facility-administered medications on file prior to visit.     No Known Allergies   Objective:  BP 128/84 (BP Location: Right Arm, Patient Position: Sitting, Cuff Size: Normal)   Pulse 74   Resp 16   Ht 5' 7.72" (1.72 m)   Wt 204 lb 12.8 oz (92.9 kg)   SpO2 99%   BMI 31.40 kg/m   Physical Exam  Constitutional: She is oriented to person, place, and time and well-developed, well-nourished, and in no distress. No distress.  Eyes: Pupils are equal, round, and reactive to light. Conjunctivae and EOM are normal.  Cardiovascular: Normal rate, regular rhythm and normal heart sounds.   Neurological: She is alert and oriented to person, place, and time. She has normal motor skills, normal sensation and normal strength. Gait normal. GCS score is 15.  Skin: Skin is warm and dry.  Psychiatric: Mood, memory, affect and judgment normal.  Vitals reviewed.   Results for orders placed or performed in visit on 01/04/17  POCT urine pregnancy  Result Value Ref Range   Preg Test, Ur Negative Negative  POCT CBC  Result Value Ref Range   WBC 7.2 4.6 - 10.2 K/uL   Lymph, poc 1.9 0.6 - 3.4   POC LYMPH PERCENT 25.7 10 - 50 %L   MID (cbc) 0.3 0 - 0.9   POC MID % 3.6 0 - 12 %M   POC Granulocyte 5.1  2 - 6.9   Granulocyte percent 70.7 37 - 80 %G   RBC 5.99 (A) 4.04 - 5.48 M/uL   Hemoglobin 12.0 (A) 12.2 - 16.2 g/dL   HCT, POC 16.138.9 09.637.7 - 47.9 %   MCV 65.0 (A) 80 - 97 fL   MCH, POC 20.1 (A) 27 - 31.2 pg   MCHC 30.9 (A) 31.8 - 35.4 g/dL   RDW, POC 04.515.8 %   Platelet Count, POC 401 142 - 424 K/uL   MPV 8.7 0 - 99.8 fL    Assessment and Plan :  1. Intractable episodic cluster headache 2. Non-intractable vomiting with nausea, unspecified vomiting type - POCT urine pregnancy - CT HEAD W & WO CONTRAST; Future - POCT CBC - Point  of care labs are negative. No concerning findings on PE. Concern for intracranial process vs headaches worsening to migrainous status. Will contact with CT results and plan.   Marco CollieWhitney Quaniya Damas, PA-C  [facility] 01/04/2017 10:06 AM

## 2017-01-23 ENCOUNTER — Ambulatory Visit (INDEPENDENT_AMBULATORY_CARE_PROVIDER_SITE_OTHER): Payer: BLUE CROSS/BLUE SHIELD | Admitting: Physician Assistant

## 2017-01-23 ENCOUNTER — Encounter: Payer: Self-pay | Admitting: Physician Assistant

## 2017-01-23 VITALS — BP 120/76 | HR 83 | Temp 99.6°F | Resp 18 | Ht 67.36 in | Wt 211.4 lb

## 2017-01-23 DIAGNOSIS — M546 Pain in thoracic spine: Secondary | ICD-10-CM | POA: Diagnosis not present

## 2017-01-23 DIAGNOSIS — M62838 Other muscle spasm: Secondary | ICD-10-CM | POA: Diagnosis not present

## 2017-01-23 MED ORDER — CYCLOBENZAPRINE HCL 5 MG PO TABS: 5.0000 mg | ORAL_TABLET | Freq: Three times a day (TID) | ORAL | 0 refills | Status: DC | PRN

## 2017-01-23 MED ORDER — NAPROXEN 500 MG PO TABS: 500.0000 mg | ORAL_TABLET | Freq: Two times a day (BID) | ORAL | 0 refills | Status: DC

## 2017-01-23 NOTE — Patient Instructions (Addendum)
I recommend resting today. However, tomorrow I would begin walking and moving around as much as tolerated. Begin stretching in a couple of days. The worse thing you can do for low back pain is lie in bed all day or sit down all day. Use medications as needed.   Just to know, flexeril can cause side effects that may impair your thinking or reactions. Be careful if you drive or do anything that requires you to be awake and alert. void drinking alcohol, which can increase some of the side effects of Flexeril.  NSAIDs like naproxen have common side effects of heartburn, stomach pain, indigestion, and headache. Could lead to renal insufficiency, stroke, or GI bleed if taken excess amounts outside of what is recommended on label long term.    You should avoid heavy lifting or strenuous repetitive activity to prevent recurrence of event. Experiment with both ice and heat and choose whichever feels best for you.  Use heat pad or ice pack, do not apply directly to skin, use barrier such as towel over the skin. Leave on for 15-20 minutes, 3-4 times a day.  Please perform exercises below. Stretches are to be performed for 2 sets, holding 10-15 seconds each. Recommended to perform this rehab twice daily within pain tolerance for 2 weeks.  Please return to clinic if symptoms worsen, do not improve in 1 week, or as needed  I also recommend getting a massage. [facility] is great!   FLEXION RANGE OF MOTION AND STRETCHING EXERCISES: STRETCH - Flexion, Single Knee to Chest   Lie on a firm bed or floor with both legs extended in front of you.  Keeping one leg in contact with the floor, bring your opposite knee to your chest. Hold your leg in place by either grabbing behind your thigh or at your knee.  Pull until you feel a gentle stretch in your lower back.   Slowly release your grasp and repeat the exercise with the opposite side.  STRETCH - Flexion, Double Knee to Chest   Lie on a firm bed or  floor with both legs extended in front of you.  Keeping one leg in contact with the floor, bring your opposite knee to your chest.  Tense your stomach muscles to support your back and then lift your other knee to your chest. Hold your legs in place by either grabbing behind your thighs or at your knees.  Pull both knees toward your chest until you feel a gentle stretch in your lower back.   Tense your stomach muscles and slowly return one leg at a time to the floor.  STRETCH - Low Trunk Rotation  Lie on a firm bed or floor. Keeping your legs in front of you, bend your knees so they are both pointed toward the ceiling and your feet are flat on the floor.  Extend your arms out to the side. This will stabilize your upper body by keeping your shoulders in contact with the floor.  Gently and slowly drop both knees together to one side until you feel a gentle stretch in your lower back.   Tense your stomach muscles to support your lower back as you bring your knees back to the starting position. Repeat the exercise to the other side.   EXTENSION RANGE OF MOTION AND FLEXIBILITY EXERCISES: STRETCH - Extension, Prone on Elbows   Lie on your stomach on the floor, a bed will be too soft. Place your palms about shoulder width apart  and at the height of your head.  Place your elbows under your shoulders. If this is too painful, stack pillows under your chest.  Allow your body to relax so that your hips drop lower and make contact more completely with the floor.  Slowly return to lying flat on the floor.  RANGE OF MOTION - Extension, Prone Press Ups  Lie on your stomach on the floor, a bed will be too soft. Place your palms about shoulder width apart and at the height of your head.  Keeping your back as relaxed as possible, slowly straighten your elbows while keeping your hips on the floor. You may adjust the placement of your hands to maximize your comfort. As you gain motion, your hands will  come more underneath your shoulders.  Slowly return to lying flat on the floor.  RANGE OF MOTION- Quadruped, Neutral Spine   Assume a hands and knees position on a firm surface. Keep your hands under your shoulders and your knees under your hips. You may place padding under your knees for comfort.  Drop your head and point your tail bone toward the ground below you. This will round out your lower back like an angry cat.    Slowly lift your head and release your tail bone so that your back sags into a large arch, like an old horse.  Repeat this until you feel limber in your lower back.  Now, find your "sweet spot." This will be the most comfortable position somewhere between the two previous positions. This is your neutral spine. Once you have found this position, tense your stomach muscles to support your lower back.  STRENGTHENING EXERCISES - Low Back Strain These exercises may help you when beginning to rehabilitate your injury. These exercises should be done near your "sweet spot." This is the neutral, low-back arch, somewhere between fully rounded and fully arched, that is your least painful position. When performed in this safe range of motion, these exercises can be used for people who have either a flexion or extension based injury. These exercises may resolve your symptoms with or without further involvement from your physician, physical therapist or athletic trainer. While completing these exercises, remember:   Muscles can gain both the endurance and the strength needed for everyday activities through controlled exercises.  Complete these exercises as instructed by your physician, physical therapist or athletic trainer. Increase the resistance and repetitions only as guided.  You may experience muscle soreness or fatigue, but the pain or discomfort you are trying to eliminate should never worsen during these exercises. If this pain does worsen, stop and make certain you are following  the directions exactly. If the pain is still present after adjustments, discontinue the exercise until you can discuss the trouble with your caregiver.  STRENGTHENING - Deep Abdominals, Pelvic Tilt  Lie on a firm bed or floor. Keeping your legs in front of you, bend your knees so they are both pointed toward the ceiling and your feet are flat on the floor.  Tense your lower abdominal muscles to press your lower back into the floor. This motion will rotate your pelvis so that your tail bone is scooping upwards rather than pointing at your feet or into the floor.  STRENGTHENING - Abdominals, Crunches   Lie on a firm bed or floor. Keeping your legs in front of you, bend your knees so they are both pointed toward the ceiling and your feet are flat on the floor. Cross your arms over  your chest.  Slightly tip your chin down without bending your neck.  Tense your abdominals and slowly lift your trunk high enough to just clear your shoulder blades. Lifting higher can put excessive stress on the lower back and does not further strengthen your abdominal muscles.  Control your return to the starting position.  STRENGTHENING - Quadruped, Opposite UE/LE Lift   Assume a hands and knees position on a firm surface. Keep your hands under your shoulders and your knees under your hips. You may place padding under your knees for comfort.  Find your neutral spine and gently tense your abdominal muscles so that you can maintain this position. Your shoulders and hips should form a rectangle that is parallel with the floor and is not twisted.  Keeping your trunk steady, lift your right hand no higher than your shoulder and then your left leg no higher than your hip. Make sure you are not holding your breath.   Continuing to keep your abdominal muscles tense and your back steady, slowly return to your starting position. Repeat with the opposite arm and leg.  STRENGTHENING - Lower Abdominals, Double Knee  Lift  Lie on a firm bed or floor. Keeping your legs in front of you, bend your knees so they are both pointed toward the ceiling and your feet are flat on the floor.  Tense your abdominal muscles to brace your lower back and slowly lift both of your knees until they come over your hips. Be certain not to hold your breath.  POSTURE AND BODY MECHANICS CONSIDERATIONS - Low Back Strain Keeping correct posture when sitting, standing or completing your activities will reduce the stress put on different body tissues, allowing injured tissues a chance to heal and limiting painful experiences. The following are general guidelines for improved posture. Your physician or physical therapist will provide you with any instructions specific to your needs. While reading these guidelines, remember:  The exercises prescribed by your provider will help you have the flexibility and strength to maintain correct postures.  The correct posture provides the best environment for your joints to work. All of your joints have less wear and tear when properly supported by a spine with good posture. This means you will experience a healthier, less painful body.  Correct posture must be practiced with all of your activities, especially prolonged sitting and standing. Correct posture is as important when doing repetitive low-stress activities (typing) as it is when doing a single heavy-load activity (lifting). RESTING POSITIONS Consider which positions are most painful for you when choosing a resting position. If you have pain with flexion-based activities (sitting, bending, stooping, squatting), choose a position that allows you to rest in a less flexed posture. You would want to avoid curling into a fetal position on your side. If your pain worsens with extension-based activities (prolonged standing, working overhead), avoid resting in an extended position such as sleeping on your stomach. Most people will find more comfort when  they rest with their spine in a more neutral position, neither too rounded nor too arched. Lying on a non-sagging bed on your side with a pillow between your knees, or on your back with a pillow under your knees will often provide some relief. Keep in mind, being in any one position for a prolonged period of time, no matter how correct your posture, can still lead to stiffness. PROPER SITTING POSTURE In order to minimize stress and discomfort on your spine, you must sit with correct posture. Sitting  with good posture should be effortless for a healthy body. Returning to good posture is a gradual process. Many people can work toward this most comfortably by using various supports until they have the flexibility and strength to maintain this posture on their own. When sitting with proper posture, your ears will fall over your shoulders and your shoulders will fall over your hips. You should use the back of the chair to support your upper back. Your lower back will be in a neutral position, just slightly arched. You may place a small pillow or folded towel at the base of your lower back for support.  When working at a desk, create an environment that supports good, upright posture. Without extra support, muscles tire, which leads to excessive strain on joints and other tissues. Keep these recommendations in mind: CHAIR:  A chair should be able to slide under your desk when your back makes contact with the back of the chair. This allows you to work closely.  The chair's height should allow your eyes to be level with the upper part of your monitor and your hands to be slightly lower than your elbows. BODY POSITION  Your feet should make contact with the floor. If this is not possible, use a foot rest.  Keep your ears over your shoulders. This will reduce stress on your neck and lower back. INCORRECT SITTING POSTURES  If you are feeling tired and unable to assume a healthy sitting posture, do not slouch or  slump. This puts excessive strain on your back tissues, causing more damage and pain. Healthier options include:  Using more support, like a lumbar pillow.  Switching tasks to something that requires you to be upright or walking.  Talking a brief walk.  Lying down to rest in a neutral-spine position. PROLONGED STANDING WHILE SLIGHTLY LEANING FORWARD  When completing a task that requires you to lean forward while standing in one place for a long time, place either foot up on a stationary 2-4 inch high object to help maintain the best posture. When both feet are on the ground, the lower back tends to lose its slight inward curve. If this curve flattens (or becomes too large), then the back and your other joints will experience too much stress, tire more quickly, and can cause pain. CORRECT STANDING POSTURES Proper standing posture should be assumed with all daily activities, even if they only take a few moments, like when brushing your teeth. As in sitting, your ears should fall over your shoulders and your shoulders should fall over your hips. You should keep a slight tension in your abdominal muscles to brace your spine. Your tailbone should point down to the ground, not behind your body, resulting in an over-extended swayback posture.  INCORRECT STANDING POSTURES  Common incorrect standing postures include a forward head, locked knees and/or an excessive swayback. WALKING Walk with an upright posture. Your ears, shoulders and hips should all line-up. PROLONGED ACTIVITY IN A FLEXED POSITION When completing a task that requires you to bend forward at your waist or lean over a low surface, try to find a way to stabilize 3 out of 4 of your limbs. You can place a hand or elbow on your thigh or rest a knee on the surface you are reaching across. This will provide you more stability so that your muscles do not fatigue as quickly. By keeping your knees relaxed, or slightly bent, you will also reduce  stress across your lower back. CORRECT LIFTING  TECHNIQUES DO :   Assume a wide stance. This will provide you more stability and the opportunity to get as close as possible to the object which you are lifting.  Tense your abdominals to brace your spine. Bend at the knees and hips. Keeping your back locked in a neutral-spine position, lift using your leg muscles. Lift with your legs, keeping your back straight.  Test the weight of unknown objects before attempting to lift them.  Try to keep your elbows locked down at your sides in order get the best strength from your shoulders when carrying an object.  Always ask for help when lifting heavy or awkward objects. INCORRECT LIFTING TECHNIQUES DO NOT:   Lock your knees when lifting, even if it is a small object.  Bend and twist. Pivot at your feet or move your feet when needing to change directions.  Assume that you can safely pick up even a paper clip without proper posture.      IF you received an x-ray today, you will receive an invoice from [facility]. Please contact [facility] at [phone] with questions or concerns regarding your invoice.   IF you received labwork today, you will receive an invoice from [facility]. Please contact [facility] at [phone] with questions or concerns regarding your invoice.   Our billing staff will not be able to assist you with questions regarding bills from these companies.  You will be contacted with the lab results as soon as they are available. The fastest way to get your results is to activate your My Chart account. Instructions are located on the last page of this paperwork. If you have not heard from [place] regarding the results in 2 weeks, please contact this office.

## 2017-01-23 NOTE — Progress Notes (Signed)
Charlene Evans  MRN: [id number] DOB: 06-Dec-1970  Subjective:  Charlene Evans is a 46 y.o. female who presents for evaluation of  back pain. The patient has had recurrent self limited episodes of low back pain in the past. Current symptoms have been present for 1 week and are gradually worsening.  Onset was related to / precipitated by no known injury. 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 The pain is located in the left sided mid back and radiates to the left hip. The pain is described as stabbing and occurs all day.  Symptoms are exacerbated by flexion, lifting, lying back, and twisting. Symptoms are improved by NSAIDs and stretching.  She denies weakness in the right leg, weakness in the left leg, tingling in the right leg, tingling in the left leg, burning pain in the right leg, burning pain in the left leg, urinary hesitancy, urinary incontinence, urinary retention, bowel incontinence, constipation, impotence, groin/perineal numbness and hematuria associated with the back pain. The patient has no "red flag" history indicative of complicated back pain.Has been using a boot for the right foot and this has helped her foot but thinks it could be contributing to her back pain. Has been evaluated by [facility] for both low back and foot pain.   Review of Systems  Constitutional: Negative for chills, diaphoresis and fever.  Gastrointestinal: Negative for nausea and vomiting.    There are no active problems to display for this patient.   Current Outpatient Prescriptions on File Prior to Visit  Medication Sig Dispense Refill  . ibuprofen (ADVIL,MOTRIN) 100 MG tablet Take 100 mg by mouth every 6 (six) hours as needed for fever.    . Vitamin D, Ergocalciferol, (DRISDOL) 50000 units CAPS capsule Take 1 capsule (50,000 Units total) by mouth every 7 (seven) days. 30 capsule 1   No current facility-administered medications on file prior to  visit.     No Known Allergies   Objective:  BP 120/76 (BP Location: Left Arm, Patient Position: Sitting, Cuff Size: Normal)   Pulse 83   Temp 99.6 F (37.6 C) (Oral)   Resp 18   Ht 5' 7.36" (1.711 m)   Wt 211 lb 6.4 oz (95.9 kg)   LMP 01/10/2017 (Approximate)   SpO2 98%   BMI 32.76 kg/m   Physical Exam  Constitutional: She is oriented to person, place, and time and well-developed, well-nourished, and in no distress.  HENT:  Head: Normocephalic and atraumatic.  Eyes: Conjunctivae are normal.  Neck: Normal range of motion.  Pulmonary/Chest: Effort normal.  Abdominal: Soft. Normal appearance. There is no tenderness. There is no CVA tenderness.  Musculoskeletal:       Right hip: Normal.       Left hip: Normal.       Cervical back: Normal.       Thoracic back: She exhibits tenderness (reproducible tenderbess with palpation of musculature and with lateral flexion to the right) and spasm (in left sided musculature). She exhibits normal range of motion, no bony tenderness and no swelling.       Lumbar back: She exhibits tenderness (in bilateral musculature, baseline for patient). She exhibits normal range of motion and no spasm.  Neurological: She is alert and oriented to person, place, and time. She has normal strength. She has a normal Straight Leg Raise Test. Gait normal. Gait normal.  Reflex Scores:      Tricep reflexes are 2+ on  the right side and 2+ on the left side.      Bicep reflexes are 2+ on the right side and 2+ on the left side.      Brachioradialis reflexes are 2+ on the right side and 2+ on the left side.      Patellar reflexes are 2+ on the right side and 2+ on the left side.      Achilles reflexes are 2+ on the right side and 2+ on the left side. Skin: Skin is warm and dry.  Psychiatric: Affect normal.  Vitals reviewed.   Assessment and Plan :  1. Acute left-sided thoracic back pain PE findings consistent with acute muscle spasm. No acute findings on neuro exam.  Recommended heat, stretching, massage therapy, NSAIDs, and muscle relaxants as needed. Given educational material for stretching. Return to clinic if symptoms worsen, do not improve in one week, or as needed - naproxen (NAPROSYN) 500 MG tablet; Take 1 tablet (500 mg total) by mouth 2 (two) times daily with a meal.  Dispense: 30 tablet; Refill: 0  2. Muscle spasm - cyclobenzaprine (FLEXERIL) 5 MG tablet; Take 1 tablet (5 mg total) by mouth 3 (three) times daily as needed for muscle spasms.  Dispense: 60 tablet; Refill: 0  Benjiman Core PA-C  [facility] 01/23/2017 3:54 PM

## 2017-01-23 NOTE — Progress Notes (Deleted)
Subjective:    Charlene Evans is a 46 y.o. female who presents for evaluation of low back pain. The patient has had {history; pain back:5285::"recurrent self limited episodes of low back pain in the past"}. Symptoms have been present for {1-10:13787} {units:19031} and are {clinical course - history:17::"unchanged"}.  Onset was related to / precipitated by {causes; back pain:32249::"no known injury"}. The pain is located in the {back pain location:31199} and {radiation:20410}. The pain is described as {pain quality:31200} and occurs {timing:31009}. {Pain rating:20411} Symptoms are exacerbated by {causes; aggravators pain back:31424}. Symptoms are improved by {pain treatments:32172}. She has also tried {pain treatments:32172} which provided no symptom relief. She has {back pain associated symptoms neuro:31426} associated with the back pain. {red flag Hx:20412}  {Common ambulatory SmartLinks:19316}  Review of Systems {ros - complete:30496}    Objective:   {Exam; back exam:5796::"Full range of motion without pain, no tenderness, no spasm, no curvature.","Normal reflexes, gait, strength and negative straight-leg raise."}    Assessment:    {back diagnosis:16452}    Plan:    {Plan; back pain:10213}

## 2017-01-24 ENCOUNTER — Telehealth: Payer: Self-pay | Admitting: Physician Assistant

## 2017-01-24 ENCOUNTER — Other Ambulatory Visit: Payer: Self-pay | Admitting: Physician Assistant

## 2017-01-24 NOTE — Telephone Encounter (Signed)
Pt CT Head with and without contrast has not been approved and is under review with [facility]. The case is to close on 10/4. [facility] can be contacted before this closes if desired at [phone]. Thanks!

## 2017-01-27 ENCOUNTER — Ambulatory Visit: Payer: BLUE CROSS/BLUE SHIELD | Admitting: Podiatry

## 2017-02-02 NOTE — Telephone Encounter (Signed)
[facility] faxed back a decision stating they are not currently covering the CT ordered. They stated that there were no abnormal brain functions to support the pt having a CT for migraines. [facility] can be contacted to further discuss this with another physician at [phone]. A provider courtesy review can be requested within 180 days of 01/26/17 if not satisfied with the decision from the physician review by calling the same number and dialing extension [phone]. An appeal can also be initiated and information regarding this is in a fax placed in the provider's box. Thanks!

## 2017-02-03 ENCOUNTER — Ambulatory Visit: Payer: BLUE CROSS/BLUE SHIELD | Admitting: Podiatry

## 2017-02-15 ENCOUNTER — Encounter: Payer: Self-pay | Admitting: Neurology

## 2017-02-23 ENCOUNTER — Institutional Professional Consult (permissible substitution): Payer: BLUE CROSS/BLUE SHIELD | Admitting: Neurology

## 2017-03-09 ENCOUNTER — Encounter: Payer: Self-pay | Admitting: Neurology

## 2017-03-10 ENCOUNTER — Institutional Professional Consult (permissible substitution): Payer: Self-pay | Admitting: Neurology

## 2017-07-18 ENCOUNTER — Other Ambulatory Visit: Payer: Self-pay | Admitting: Nurse Practitioner

## 2017-07-18 DIAGNOSIS — G4452 New daily persistent headache (NDPH): Secondary | ICD-10-CM

## 2017-07-26 ENCOUNTER — Ambulatory Visit
Admission: RE | Admit: 2017-07-26 | Discharge: 2017-07-26 | Disposition: A | Discharge status: Home / Self Care | Payer: BLUE CROSS/BLUE SHIELD | Source: Ambulatory Visit | Attending: Nurse Practitioner | Admitting: Nurse Practitioner

## 2017-07-26 DIAGNOSIS — G4452 New daily persistent headache (NDPH): Secondary | ICD-10-CM

## 2018-03-31 ENCOUNTER — Emergency Department (HOSPITAL_COMMUNITY): Payer: BLUE CROSS/BLUE SHIELD

## 2018-03-31 ENCOUNTER — Encounter (HOSPITAL_COMMUNITY): Payer: Self-pay

## 2018-03-31 ENCOUNTER — Emergency Department (HOSPITAL_COMMUNITY)
Admission: EM | Admit: 2018-03-31 | Discharge: 2018-03-31 | Disposition: A | Discharge status: Home / Self Care | Payer: BLUE CROSS/BLUE SHIELD | Attending: Emergency Medicine | Admitting: Emergency Medicine

## 2018-03-31 DIAGNOSIS — R030 Elevated blood-pressure reading, without diagnosis of hypertension: Secondary | ICD-10-CM | POA: Insufficient documentation

## 2018-03-31 DIAGNOSIS — R51 Headache: Secondary | ICD-10-CM | POA: Diagnosis not present

## 2018-03-31 DIAGNOSIS — Z79899 Other long term (current) drug therapy: Secondary | ICD-10-CM | POA: Insufficient documentation

## 2018-03-31 DIAGNOSIS — D649 Anemia, unspecified: Secondary | ICD-10-CM | POA: Diagnosis not present

## 2018-03-31 DIAGNOSIS — R519 Headache, unspecified: Secondary | ICD-10-CM

## 2018-03-31 DIAGNOSIS — D509 Iron deficiency anemia, unspecified: Secondary | ICD-10-CM

## 2018-03-31 LAB — CBC WITH DIFFERENTIAL/PLATELET
Band Neutrophils: 0 %
Basophils Absolute: 0 10*3/uL (ref 0.0–0.1)
Basophils Relative: 0 %
Blasts: 0 %
Eosinophils Absolute: 0 10*3/uL (ref 0.0–0.5)
Eosinophils Relative: 0 %
HCT: 34.6 % — ABNORMAL LOW (ref 36.0–46.0)
Hemoglobin: 10.7 g/dL — ABNORMAL LOW (ref 12.0–15.0)
Lymphocytes Relative: 15 %
Lymphs Abs: 1.3 10*3/uL (ref 0.7–4.0)
MCH: 19.1 pg — ABNORMAL LOW (ref 26.0–34.0)
MCHC: 30.9 g/dL (ref 30.0–36.0)
MCV: 61.8 fL — ABNORMAL LOW (ref 80.0–100.0)
Metamyelocytes Relative: 0 %
Monocytes Absolute: 0.4 10*3/uL (ref 0.1–1.0)
Monocytes Relative: 5 %
Myelocytes: 0 %
Neutro Abs: 7.2 10*3/uL (ref 1.7–7.7)
Neutrophils Relative %: 80 %
Other: 0 %
Platelets: 355 10*3/uL (ref 150–400)
Promyelocytes Relative: 0 %
RBC: 5.6 MIL/uL — ABNORMAL HIGH (ref 3.87–5.11)
RDW: 16.1 % — ABNORMAL HIGH (ref 11.5–15.5)
WBC: 8.9 10*3/uL (ref 4.0–10.5)
nRBC: 0 % (ref 0.0–0.2)
nRBC: 0 /100 WBC

## 2018-03-31 LAB — I-STAT BETA HCG BLOOD, ED (MC, WL, AP ONLY): I-stat hCG, quantitative: <5 m[IU]/mL (ref <5)

## 2018-03-31 LAB — BASIC METABOLIC PANEL
Anion gap: 9 (ref 5–15)
BUN: 8 mg/dL (ref 6–20)
CO2: 24 mmol/L (ref 22–32)
Calcium: 8.7 mg/dL — ABNORMAL LOW (ref 8.9–10.3)
Chloride: 104 mmol/L (ref 98–111)
Creatinine, Ser: 0.68 mg/dL (ref 0.44–1.00)
GFR calc Af Amer: >60 mL/min (ref >60)
GFR calc non Af Amer: >60 mL/min (ref >60)
Glucose, Bld: 103 mg/dL — ABNORMAL HIGH (ref 70–99)
Potassium: 3.6 mmol/L (ref 3.5–5.1)
Sodium: 137 mmol/L (ref 135–145)

## 2018-03-31 MED ORDER — SODIUM CHLORIDE 0.9 % IV BOLUS
1000.0000 mL | Freq: Once | INTRAVENOUS | Status: AC
  Administered 2018-03-31: 1000 mL via INTRAVENOUS

## 2018-03-31 MED ORDER — PROCHLORPERAZINE EDISYLATE 10 MG/2ML IJ SOLN
10.0000 mg | Freq: Once | INTRAMUSCULAR | Status: AC
  Administered 2018-03-31: 10 mg via INTRAVENOUS
  Filled 2018-03-31: qty 2

## 2018-03-31 MED ORDER — DIPHENHYDRAMINE HCL 50 MG/ML IJ SOLN
25.0000 mg | Freq: Once | INTRAMUSCULAR | Status: AC
  Administered 2018-03-31: 25 mg via INTRAVENOUS
  Filled 2018-03-31: qty 1

## 2018-03-31 MED ORDER — IOPAMIDOL (ISOVUE-370) INJECTION 76%
100.0000 mL | Freq: Once | INTRAVENOUS | Status: AC | PRN
  Administered 2018-03-31: 100 mL via INTRAVENOUS

## 2018-03-31 NOTE — ED Triage Notes (Signed)
Onset  6am upon awakening, headache on left side of head and left arm numbness.  Pt took sinus medication and Tylenol with no relief, pain worsening.  No blurred vision, speech difficulties, or gait disturbances.

## 2018-03-31 NOTE — Discharge Instructions (Signed)
Please see the information and instructions below regarding your visit.  Your diagnoses today include:  1. Left-sided headache   2. Microcytic anemia     You were seen and treated in the emergency department today for headache. Fortunately, your vitals, exam, and work-up is reassuring with no apparent emergent cause for your headache at this time.  Tests performed today include: See side panel of your discharge paperwork for testing performed today. Vital signs are listed at the bottom of these instructions.   The CT scan of the arteries of your head and neck in addition to the imaging of your brain today are normal.  You are anemic, consistent with your prior values.  Medications prescribed:    Try to avoid daily or regular use of tylenol, aspirin, ibuprofen, and other overt-the-counter pain medications as this can contribute to rebound headaches.   Take any prescribed medications only as prescribed, and any over the counter medications only as directed on the packaging.  Home care instructions:   Drink plenty of fluids at home. This will help with your headache. Be cautious with caffeine use, as this can cause your headache to rebound when the effects wear off. If you drink more than 2 cups of coffee/caffeinated tea, or caffeinated soda per day, I suggest you wean down that amount.  Please follow any educational materials contained in this packet.   Follow-up instructions: Please follow-up with your primary care provider in one week for further evaluation of your symptoms if they are not completely improved.   I also placed a referral to neurology.  If you are having a changing quality or pattern of headaches, it is important that he be evaluated by specialist.  He should be treating a call, however he do not hear back from this clinic in 2 weeks, please call the number listed on your paperwork.  Return instructions:  Please return to the Emergency Department if you experience  worsening symptoms. It is VERY important that you monitor your symptoms at home. If you develop worsening headache, new fever, new neck stiffness, rash, focal weakness or numbness, or any other new or concerning symptoms, please return to the ED immediately, as these may be signs that your headache has become a potentially serious and life-threatening condition.  Please return if you have any other emergent concerns.  Additional Information:   Your vital signs today were: BP 138/68    Pulse 76    Temp 97.9 F (36.6 C) (Oral)    Resp 16    LMP 03/25/2018    SpO2 100%  If your blood pressure (BP) was elevated on multiple readings during this visit above 130 for the top number or above 80 for the bottom number, please have this repeated by your primary care provider within one month. --------------  Thank you for allowing us to participate in your care today.

## 2018-03-31 NOTE — ED Provider Notes (Addendum)
[facility] Provider Note   CSN: [id number] Arrival date & time: 03/31/18  1148     History   Chief Complaint Chief Complaint  Patient presents with  . Headache    HPI [facility] is a 47 y.o. adult.  HPI  Patient is a 46 year old adult presenting for global headache.  Patient reports that they have a history of chronic daily headaches, usually pressure-like in sensation and frontal.  They have been evaluated multiple times in the past by primary care for this, including a CT noncontrast in April 2019 that was without abnormality.  Patient reports that they woke up approximately 6:30 AM this morning with a sudden onset left-sided headache that has progressed to be global in nature.  Reports radiation on the left side of the neck.  Reports some decreased subjective sensation over the left deltoid, but no weakness, numbness elsewhere on the body, visual disturbance, speech disturbance, changes in mental status, dizziness, lightheadedness, or gait disturbance.  Patient denies any neck stiffness.  Patient denies any fever or chills.  No rash.  Patient has tried Tylenol and "sinus medicine" this morning without full relief.  Past Medical History:  Diagnosis Date  . Anemia     There are no active problems to display for this patient.   History reviewed. No pertinent surgical history.   OB History   None      Home Medications    Prior to Admission medications   Medication Sig Start Date End Date Taking? Authorizing Provider  cyclobenzaprine (FLEXERIL) 5 MG tablet Take 1 tablet (5 mg total) by mouth 3 (three) times daily as needed for muscle spasms. 01/23/17   Benjiman Core D, PA-C  ibuprofen (ADVIL,MOTRIN) 100 MG tablet Take 100 mg by mouth every 6 (six) hours as needed for fever.    [provider]  naproxen (NAPROSYN) 500 MG tablet Take 1 tablet (500 mg total) by mouth 2 (two) times daily with a meal. 01/23/17   Barnett Abu,  [place] D, PA-C  Vitamin D, Ergocalciferol, (DRISDOL) 50000 units CAPS capsule Take 1 capsule (50,000 Units total) by mouth every 7 (seven) days. 05/02/16   Bing Neighbors, FNP    Family History Family History  Problem Relation Age of Onset  . Diabetes Mother   . Hypertension Mother   . Cancer Father   . Cancer Maternal Grandmother   . Diabetes Maternal Grandmother   . Cancer Maternal Grandfather   . Diabetes Maternal Grandfather   . Hypertension Maternal Grandfather     Social History Social History   Tobacco Use  . Smoking status: Never Smoker  . Smokeless tobacco: Never Used  Substance Use Topics  . Alcohol use: No    Alcohol/week: 0.0 standard drinks  . Drug use: No     Allergies   Patient has no known allergies.   Review of Systems Review of Systems  Constitutional: Negative for chills and fever.  HENT: Negative for congestion and sore throat.   Eyes: Negative for visual disturbance.  Respiratory: Negative for cough, chest tightness and shortness of breath.   Cardiovascular: Negative for chest pain, palpitations and leg swelling.  Gastrointestinal: Negative for abdominal pain, nausea and vomiting.  Genitourinary: Negative for dysuria and flank pain.  Musculoskeletal: Negative for back pain and myalgias.  Skin: Negative for rash.  Neurological: Positive for numbness and headaches. Negative for dizziness, syncope, weakness and light-headedness.     Physical Exam Updated Vital Signs BP 138/68  Pulse 76   Temp 97.9 F (36.6 C) (Oral)   Resp 16   LMP 03/25/2018   SpO2 100%   Physical Exam  Constitutional: She appears well-developed and well-nourished. No distress.  HENT:  Head: Normocephalic and atraumatic.  Mouth/Throat: Oropharynx is clear and moist.  Eyes: Pupils are equal, round, and reactive to light. Conjunctivae and EOM are normal.  Neck: Normal range of motion. Neck supple.  Cardiovascular: Normal rate, regular rhythm, S1 normal and S2  normal.  No murmur heard. Pulmonary/Chest: Effort normal and breath sounds normal. She has no wheezes. She has no rales.  Abdominal: Soft. She exhibits no distension. There is no tenderness. There is no guarding.  Musculoskeletal: Normal range of motion. She exhibits no edema or deformity.  Lymphadenopathy:    She has no cervical adenopathy.  Neurological: She is alert. GCS eye subscore is 4. GCS verbal subscore is 5. GCS motor subscore is 6.  Mental Status:  Alert, oriented, thought content appropriate, able to give a coherent history. Speech fluent without evidence of aphasia. Able to follow 2 step commands without difficulty.  Cranial Nerves:  II:  Peripheral visual fields grossly normal, pupils equal, round, reactive to light III,IV, VI: ptosis not present, extra-ocular motions intact bilaterally  V,VII: smile symmetric, facial light touch sensation equal VIII: hearing grossly normal to voice  X: uvula elevates symmetrically  XI: bilateral shoulder shrug symmetric and strong XII: midline tongue extension without fassiculations Motor:  Normal tone. 5/5 in upper and lower extremities bilaterally including strong and equal grip strength and dorsiflexion/plantar flexion Sensory: Subjective decreased sensation over left deltoid. Pinprick and light touch normal in all extremities.  Deep Tendon Reflexes: 2+ and symmetric in the biceps and patella.  Cerebellar: normal finger-to-nose with bilateral upper extremities Gait: normal gait and balance Stance: No pronator drift and good coordination, strength, and position sense with tapping of bilateral arms (performed in sitting position). CV: distal pulses palpable throughout    Skin: Skin is warm and dry. No rash noted. No erythema.  Psychiatric: She has a normal mood and affect. Her behavior is normal. Judgment and thought content normal.  Nursing note and vitals reviewed.    ED Treatments / Results  Labs (all labs ordered are listed, but  only abnormal results are displayed) Labs Reviewed  BASIC METABOLIC PANEL - Abnormal; Notable for the following components:      Result Value   Glucose, Bld 103 (*)    Calcium 8.7 (*)    All other components within normal limits  CBC WITH DIFFERENTIAL/PLATELET - Abnormal; Notable for the following components:   RBC 5.60 (*)    Hemoglobin 10.7 (*)    HCT 34.6 (*)    MCV 61.8 (*)    MCH 19.1 (*)    RDW 16.1 (*)    All other components within normal limits  I-STAT BETA HCG BLOOD, ED ([facility], [facility], [facility] ONLY)    EKG None  Radiology Ct Angio Head W/cm &/or Wo Cm  Result Date: 03/31/2018 CLINICAL DATA:  Clinical suspicion of subarachnoid hemorrhage. Left-sided headache and left arm numbness upon wakening today. EXAM: CT ANGIOGRAPHY HEAD AND NECK TECHNIQUE: Multidetector CT imaging of the head and neck was performed using the standard protocol during bolus administration of intravenous contrast. Multiplanar CT image reconstructions and MIPs were obtained to evaluate the vascular anatomy. Carotid stenosis measurements (when applicable) are obtained utilizing NASCET criteria, using the distal internal carotid diameter as the denominator. CONTRAST:  100mL ISOVUE-370 IOPAMIDOL (ISOVUE-370) INJECTION  76% COMPARISON:  CT 07/26/2017 FINDINGS: CT HEAD FINDINGS Brain: The brain shows a normal appearance without evidence of malformation, atrophy, old or acute small or large vessel infarction, mass lesion, hemorrhage, hydrocephalus or extra-axial collection. Vascular: No hyperdense vessel. No evidence of atherosclerotic calcification. Skull: Normal. No traumatic finding. No focal bone lesion. Sinuses/Orbits: Sinuses are clear. Orbits appear normal. Mastoids are clear. Other: None significant CTA NECK FINDINGS Aortic arch: Normal Right carotid system: Common carotid artery widely patent to the bifurcation. No carotid bifurcation disease. Cervical ICA normal. Left carotid system: Common carotid artery widely patent to  the bifurcation. No carotid bifurcation disease. Cervical ICA normal. Vertebral arteries: Right vertebral artery is dominant. Left vertebral artery arises from the arch. No vertebral origin stenosis. Both vertebral arteries appear widely patent to the foramen magnum. Skeleton: Ordinary mild cervical spondylosis. Other neck: No mass or lymphadenopathy. Upper chest: Normal Review of the MIP images confirms the above findings CTA HEAD FINDINGS Anterior circulation: Both internal carotid arteries widely patent through the skull base and siphon regions. The anterior and middle cerebral vessels are normal without stenosis, aneurysm or vascular malformation. Posterior circulation: Both vertebral arteries are patent to the basilar. No basilar stenosis. No posterior circulation aneurysm. Branch vessels all show flow. Venous sinuses: Patent and normal. Anatomic variants: None significant. Delayed phase: No abnormal enhancement. Review of the MIP images confirms the above findings IMPRESSION: Normal CT angiography of the neck and head. Electronically Signed   By: Paulina Fusi M.D.   On: 03/31/2018 14:29   Ct Angio Neck W And/or Wo Contrast  Result Date: 03/31/2018 CLINICAL DATA:  Clinical suspicion of subarachnoid hemorrhage. Left-sided headache and left arm numbness upon wakening today. EXAM: CT ANGIOGRAPHY HEAD AND NECK TECHNIQUE: Multidetector CT imaging of the head and neck was performed using the standard protocol during bolus administration of intravenous contrast. Multiplanar CT image reconstructions and MIPs were obtained to evaluate the vascular anatomy. Carotid stenosis measurements (when applicable) are obtained utilizing NASCET criteria, using the distal internal carotid diameter as the denominator. CONTRAST:  ISOVUE-370 IOPAMIDOL (ISOVUE-370) INJECTION 76% COMPARISON:  CT 07/26/2017 FINDINGS: CT HEAD FINDINGS Brain: The brain shows a normal appearance without evidence of malformation, atrophy, old or  acute small or large vessel infarction, mass lesion, hemorrhage, hydrocephalus or extra-axial collection. Vascular: No hyperdense vessel. No evidence of atherosclerotic calcification. Skull: Normal. No traumatic finding. No focal bone lesion. Sinuses/Orbits: Sinuses are clear. Orbits appear normal. Mastoids are clear. Other: None significant CTA NECK FINDINGS Aortic arch: Normal Right carotid system: Common carotid artery widely patent to the bifurcation. No carotid bifurcation disease. Cervical ICA normal. Left carotid system: Common carotid artery widely patent to the bifurcation. No carotid bifurcation disease. Cervical ICA normal. Vertebral arteries: Right vertebral artery is dominant. Left vertebral artery arises from the arch. No vertebral origin stenosis. Both vertebral arteries appear widely patent to the foramen magnum. Skeleton: Ordinary mild cervical spondylosis. Other neck: No mass or lymphadenopathy. Upper chest: Normal Review of the MIP images confirms the above findings CTA HEAD FINDINGS Anterior circulation: Both internal carotid arteries widely patent through the skull base and siphon regions. The anterior and middle cerebral vessels are normal without stenosis, aneurysm or vascular malformation. Posterior circulation: Both vertebral arteries are patent to the basilar. No basilar stenosis. No posterior circulation aneurysm. Branch vessels all show flow. Venous sinuses: Patent and normal. Anatomic variants: None significant. Delayed phase: No abnormal enhancement. Review of the MIP images confirms the above findings IMPRESSION: Normal CT angiography of  the neck and head. Electronically Signed   By: Paulina Fusi M.D.   On: 03/31/2018 14:29    Procedures Procedures (including critical care time)  Medications Ordered in ED Medications  sodium chloride 0.9 % bolus 1,000 mL (1,000 mLs Intravenous New Bag/Given 03/31/18 1305)  prochlorperazine (COMPAZINE) injection 10 mg (10 mg Intravenous Given  03/31/18 1306)  diphenhydrAMINE (BENADRYL) injection 25 mg (25 mg Intravenous Given 03/31/18 1305)     Initial Impression / Assessment and Plan / ED Course  I have reviewed the triage vital signs and the nursing notes.  Pertinent labs & imaging results that were available during my care of the patient were reviewed by me and considered in my medical decision making (see chart for details).  Clinical Course as of Mar 31 1730  Sat Mar 31, 2018  1326 Improving. Do not feel that HA is representative of HTN urgency/emergency.  BP: 138/68 [AM]  1340 Stable from prior. Suspect IDA 2/2 heavy menses.   Hemoglobin(!): 10.7 [AM]  1517 Reassessed.  Patient reports headache 2 out of 10.  Updated on results.  Patient wanting to eat.   [AM]  1605 Reassessed.  Tolerating p.o.  Reports resolved headache.  Reports resolved decreased sensation over the deltoid.   [AM]  1729 This blood pressure was taken after discharge vitals reviewed and discharge printed. Pt asymptomatic and in no distress during last eval. Pt informed that she needed to follow up with PCP for Bp.   BP(!): 174/91 [AM]    Clinical Course User Index [AM] Elisha Ponder, PA-C    Patient is a 47 year old female patient without high-risk features of headache including: sudden onset/thunderclap HA, no similar headache in past, altered mental status, accompanying seizure, headache with exertion, age > 74, history of immunocompromise, neck or shoulder pain, fever, use of anticoagulation, family history of spontaneous SAH, concomitant drug use, toxic exposure.   Patient has a normal complete neurological exam on my evaluation, normal vital signs, normal level of consciousness, no signs of meningismus, is well-appearing/non-toxic appearing, no signs of trauma, no pain over temporal arteries.   Patient imaged close to the 6-hour time window to evaluate for subarachnoid hemorrhage.  Imaging was negative.  Lab work otherwise showing microcytic  anemia, consistent with prior and consistent with patient's history of heavy menses.  Recommended patient follow-up with her OB/GYN to discuss her menses.  Suspect complicated migraine given resolution with fluids and Compazine, and compete resolution of subjective decreased sensation over deltoid.  Case discussed with attending physician, Dr. Thayer Ohm Tegeler, and agree that MRI not indicated with resolution of symptoms and extensive HA history. Based on changing pattern of headache, recommend patient that she follow-up with neurology.  Referral given.    Patient had isolated systolic hypertension when first presented to the emergency department.  This resolved.  Do not feel it was contributory to her symptoms.  Recommended recheck at her primary care provider within 1 month.  No dangerous or life-threatening conditions suspected or identified by history, physical exam, and by work-up. No indications for hospitalization identified.  Return precautions given for any severe worsening headache, headaches with visual changes, weakness or numbness, neck stiffness or tenderness, or any new or worsening symptoms.  Patient is in understanding and agrees with the plan of care.  This is a supervised visit with Dr. Thayer Ohm Tegeler. Evaluation, management, and discharge planning discussed with this attending physician.  Final Clinical Impressions(s) / ED Diagnoses   Final diagnoses:  Left-sided headache  Microcytic  anemia  Elevated blood-pressure reading without diagnosis of hypertension    ED Discharge Orders         Ordered    Ambulatory referral to Neurology    Comments:  An appointment is requested in approximately: 4 weeks   03/31/18 1651           Delia Chimes 03/31/18 1710    Elisha Ponder, [place] 03/31/18 1731    Tegeler, Canary Brim, MD 03/31/18 2001

## 2018-03-31 NOTE — ED Notes (Signed)
Pt stable and ambulatory for discharge, states understanding follow up.  

## 2018-04-09 ENCOUNTER — Ambulatory Visit: Payer: BLUE CROSS/BLUE SHIELD | Admitting: Nurse Practitioner

## 2018-04-09 ENCOUNTER — Encounter: Payer: Self-pay | Admitting: Nurse Practitioner

## 2018-04-09 ENCOUNTER — Other Ambulatory Visit (HOSPITAL_COMMUNITY)
Admission: RE | Admit: 2018-04-09 | Discharge: 2018-04-09 | Disposition: A | Discharge status: Home / Self Care | Payer: BLUE CROSS/BLUE SHIELD | Source: Ambulatory Visit | Attending: Nurse Practitioner | Admitting: Nurse Practitioner

## 2018-04-09 VITALS — Ht 66.6 in | Wt 212.4 lb

## 2018-04-09 DIAGNOSIS — G44229 Chronic tension-type headache, not intractable: Secondary | ICD-10-CM

## 2018-04-09 DIAGNOSIS — N898 Other specified noninflammatory disorders of vagina: Secondary | ICD-10-CM

## 2018-04-09 DIAGNOSIS — Z3009 Encounter for other general counseling and advice on contraception: Secondary | ICD-10-CM

## 2018-04-09 LAB — POCT URINALYSIS DIPSTICK
Bilirubin, UA: NEGATIVE
Blood, UA: NEGATIVE
Glucose, UA: NEGATIVE
Ketones, UA: NEGATIVE
Nitrite, UA: NEGATIVE
Protein, UA: POSITIVE — AB
Spec Grav, UA: 1.025 (ref 1.010–1.025)
Urobilinogen, UA: 1 E.U./dL
pH, UA: 6 (ref 5.0–8.0)

## 2018-04-09 MED ORDER — FLUCONAZOLE 150 MG PO TABS: 150.0000 mg | ORAL_TABLET | Freq: Every day | ORAL | 0 refills | Status: DC

## 2018-04-09 NOTE — Progress Notes (Signed)
Subjective:     Patient ID: [facility] , adult    DOB: Mar 08, 1971 , 47 y.o.   MRN: [id number]   Chief Complaint  Patient presents with  . Headache    patient states she had went to the ER due to her having a really bad headache. patient states her headache is not as bad as it was  . Vaginal Discharge    patient state she has cottage cheese like discharge and she has some odor.    HPI  Headache   This is a recurrent problem. The current episode started 1 to 4 weeks ago. The problem occurs intermittently. The problem has been waxing and waning. Pain location: feels like pressure and cutting off. The pain does not radiate. The pain quality is not similar to prior headaches. The quality of the pain is described as aching. The pain is at a severity of 0/10. The patient is experiencing no pain. Associated symptoms include dizziness. Pertinent negatives include no abdominal pain or coughing. Exacerbated by: bending over. She has tried NSAIDs for the symptoms. The treatment provided no relief. Her past medical history is significant for migraine headaches and sinus disease. There is no history of hypertension.  Vaginal Discharge  Associated symptoms include headaches. Pertinent negatives include no abdominal pain or coughing.     Past Medical History:  Diagnosis Date  . Anemia      Family History  Problem Relation Age of Onset  . Diabetes Mother   . Hypertension Mother   . Cancer Father   . Cancer Maternal Grandmother   . Diabetes Maternal Grandmother   . Cancer Maternal Grandfather   . Diabetes Maternal Grandfather   . Hypertension Maternal Grandfather      Current Outpatient Medications:  .  ibuprofen (ADVIL,MOTRIN) 100 MG tablet, Take 200 mg by mouth every 6 (six) hours as needed for pain or fever. , Disp: , Rfl:  .  Vitamin D, Ergocalciferol, (DRISDOL) 50000 units CAPS capsule, Take 1 capsule (50,000 Units total) by mouth every 7 (seven) days. (Patient not taking: Reported  on 03/31/2018), Disp: 30 capsule, Rfl: 1   No Known Allergies   Review of Systems  Respiratory: Negative for cough.   Gastrointestinal: Negative for abdominal pain.  Endocrine: Negative for polydipsia, polyphagia and polyuria.  Genitourinary: Positive for vaginal discharge.  Neurological: Positive for dizziness and headaches.     Today's Vitals   04/09/18 1611  Weight: 212 lb 6.4 oz (96.3 kg)  Height: 5' 6.6" (1.692 m)  PainSc: 0-No pain   Body mass index is 33.67 kg/m.   Objective:  Physical Exam Vitals signs reviewed.  Constitutional:      Appearance: She is well-developed. She is obese.  Cardiovascular:     Rate and Rhythm: Normal rate.     Heart sounds: Normal heart sounds. No murmur.  Pulmonary:     Effort: Pulmonary effort is normal.     Breath sounds: Normal breath sounds.  Skin:    General: Skin is warm and dry.     Capillary Refill: Capillary refill takes less than 2 seconds.  Neurological:     Mental Status: She is alert.         Assessment And Plan:     1. Vaginal discharge  Will check for bacterial vaginosis  Mild amount of clear discharge to vaginal area, negative odor  Will treat with Diflucan due to the thick nature and vaginal itching  - POCT Urinalysis Dipstick (78295) - Cytology (  oral, anal, urethral) ancillary only - fluconazole (DIFLUCAN) 150 MG tablet; Take 1 tablet (150 mg total) by mouth daily.  Dispense: 2 tablet; Refill: 0  2. Chronic tension-type headache, not intractable  Seen in ER 2 weeks ago, CT scan negative  Headache is better however she admits to not taking her medication regularly, drinking adequate amounts of water and exercising  She did have an elevated blood pressure at ED - Ambulatory referral to ENT  3. Birth control counseling  She is interested in having  Tubal ligation  Will refer to gynecology for evaluation - Ambulatory referral to Gynecology    Arnette FeltsJanece Nazareth Kirk, FNP

## 2018-04-11 ENCOUNTER — Inpatient Hospital Stay: Payer: Self-pay | Admitting: Nurse Practitioner

## 2018-04-11 LAB — CYTOLOGY, (ORAL, ANAL, URETHRAL) ANCILLARY ONLY
Chlamydia: NEGATIVE
Neisseria Gonorrhea: NEGATIVE
Trichomonas: NEGATIVE

## 2018-04-12 LAB — URINE CYTOLOGY ANCILLARY ONLY: Candida vaginitis: NEGATIVE

## 2018-04-17 ENCOUNTER — Encounter: Payer: Self-pay | Admitting: Nurse Practitioner

## 2018-04-20 ENCOUNTER — Ambulatory Visit: Payer: BLUE CROSS/BLUE SHIELD | Admitting: Nurse Practitioner

## 2018-05-01 ENCOUNTER — Other Ambulatory Visit: Payer: Self-pay | Admitting: Obstetrics and Gynecology

## 2018-05-22 ENCOUNTER — Other Ambulatory Visit: Payer: Self-pay

## 2018-05-22 ENCOUNTER — Encounter: Payer: Self-pay | Admitting: Internal Medicine

## 2018-05-22 ENCOUNTER — Ambulatory Visit (INDEPENDENT_AMBULATORY_CARE_PROVIDER_SITE_OTHER): Payer: BLUE CROSS/BLUE SHIELD | Admitting: Internal Medicine

## 2018-05-22 VITALS — BP 132/62 | HR 71 | Temp 98.2°F | Ht 66.6 in | Wt 210.4 lb

## 2018-05-22 DIAGNOSIS — R51 Headache: Secondary | ICD-10-CM | POA: Diagnosis not present

## 2018-05-22 DIAGNOSIS — R519 Headache, unspecified: Secondary | ICD-10-CM

## 2018-05-22 MED ORDER — LORATADINE-PSEUDOEPHEDRINE ER 10-240 MG PO TB24: 1.0000 | ORAL_TABLET | Freq: Every day | ORAL | 0 refills | Status: DC

## 2018-05-22 MED ORDER — FLUTICASONE PROPIONATE 50 MCG/ACT NA SUSP: 2.0000 | Freq: Every day | NASAL | 0 refills | Status: DC

## 2018-05-22 NOTE — Patient Instructions (Signed)
Sinus Headache          A sinus headache occurs when your sinuses become clogged or swollen. Sinuses are air-filled spaces in your skull that are behind the bones of your face and forehead. Sinus headaches can range from mild to severe.  What are the causes?  A sinus headache can result from various conditions that affect the sinuses. Common causes include:   Colds.   Sinus infections.   Allergies.  Many people confuse sinus headaches with migraines or tension headaches because those headaches can also cause facial pain and nasal symptoms.  What are the signs or symptoms?  The main symptom of this condition is a headache that may feel like pain or pressure in your face, forehead, ears, or upper teeth. People who have a sinus headache often have other symptoms, such as:   Congested or runny nose.   Fever.   Inability to smell.  Weather changes can make symptoms worse.  How is this diagnosed?  This condition may be diagnosed based on:   A physical exam and medical history.   Imaging tests, such as a CT scan or MRI, to check for problems with the sinuses.   Examination of the sinuses using a thin tool with a camera that is inserted through your nose (endoscopy).  How is this treated?  Treatment for this condition depends on the cause.   Sinus pain that is caused by a sinus infection may be treated with antibiotic medicine.   Sinus pain that is caused by allergies may be helped by allergy medicines (antihistamines) and medicated nasal sprays.   Sinus pain that is caused by congestion may be helped by rinsing out (flushing) the nose and sinuses with saline solution.   Sinus surgery may be needed in some cases if other treatments do not help.  Follow these instructions at home:  General instructions   If directed:  ? Apply a warm, moist washcloth to your face to help relieve pain.  ? Use a nasal saline wash.  Medicines     Take over-the-counter and prescription medicines only as told by your health care  provider.   If you were prescribed an antibiotic medicine, take it as told by your health care provider. Do not stop taking the antibiotic even if you start to feel better.   If you have congestion, use a nasal spray to help lessen pressure.  Hydrate and humidify   Drink enough water to keep your urine clear or pale yellow. Staying hydrated will help to thin your mucus.   Use a cool mist humidifier to keep the humidity level in your home above 50%.   Inhale steam for 10-15 minutes, 3-4 times a day or as told by your health care provider. You can do this in the bathroom while a hot shower is running.   Limit your exposure to cool or dry air.  Contact a health care provider if:   You have a headache more than one time a week.   You have sensitivity to light or sound.   You develop a fever.   You feel nauseous or you vomit.   Your headaches do not get better with treatment. Many people think that they have a sinus headache when they actually have a migraine or a tension headache.  Get help right away if:   You have vision problems.   You have sudden, severe pain in your face or head.   You have a seizure.     You are confused.   You have a stiff neck.  Summary   A sinus headache occurs when your sinuses become clogged or swollen.   A sinus headache can result from various conditions that affect the sinuses, such as a cold, a sinus infection, or an allergy.   Treatment for this condition depends on the cause. It may include medicine, such as antibiotics or antihistamines.  This information is not intended to replace advice given to you by your health care provider. Make sure you discuss any questions you have with your health care provider.  Document Released: 05/19/2004 Document Revised: 01/20/2017 Document Reviewed: 01/20/2017  [employer]vier Interactive Patient Education  2019 [employer]

## 2018-05-22 NOTE — Progress Notes (Signed)
Subjective:     Patient ID: Charlene Evans , female    DOB: 1971/02/11 , 48 y.o.   MRN: [id number]   Chief Complaint  Patient presents with  . Headache    2 months/ hypertention/ runny nose/ eye pressure in left eye    HPI Has hx of chronic HA's. Had ER visit for HA  2 months ago and had neg CT 2 but BP was high though the pain was better with IV meds. She has not been doing diaries of her BP.  This time three days ago developed Pain  on forehead, nose has been stuffy and rhinitis which is worse x 3 days, but keeps them all the time. Denies mild photophobia today. Denies nausea. HA is constant. Worse with bending over and feels pressure on forehead.  Last night got a little better with sinus pills.     Past Medical History:  Diagnosis Date  . Anemia      Family History  Problem Relation Age of Onset  . Diabetes Mother   . Hypertension Mother   . Cancer Father   . Cancer Maternal Grandmother   . Diabetes Maternal Grandmother   . Cancer Maternal Grandfather   . Diabetes Maternal Grandfather   . Hypertension Maternal Grandfather      Current Outpatient Medications:  .  ibuprofen (ADVIL,MOTRIN) 100 MG tablet, Take 200 mg by mouth every 6 (six) hours as needed for pain or fever. , Disp: , Rfl:  .  Vitamin D, Ergocalciferol, (DRISDOL) 50000 units CAPS capsule, Take 1 capsule (50,000 Units total) by mouth every 7 (seven) days., Disp: 30 capsule, Rfl: 1 .  fluconazole (DIFLUCAN) 150 MG tablet, Take 1 tablet (150 mg total) by mouth daily. (Patient not taking: Reported on 05/22/2018), Disp: 2 tablet, Rfl: 0   No Known Allergies   Review of Systems  Constitutional: Negative for chills, diaphoresis and fever.  HENT: Positive for congestion, postnasal drip, rhinorrhea, sinus pressure and sinus pain. Negative for ear discharge, ear pain, sneezing and sore throat.   Eyes: Positive for photophobia. Negative for discharge and visual disturbance.  Respiratory: Negative for cough.    Gastrointestinal: Negative for nausea.  Musculoskeletal: Negative for myalgias.  Neurological: Positive for headaches. Negative for dizziness, tremors, facial asymmetry, weakness, light-headedness and numbness.     Today's Vitals   05/22/18 1515  BP: 132/62  Pulse: 71  Temp: 98.2 F (36.8 C)  TempSrc: Oral  SpO2: 98%  Weight: 210 lb 6.4 oz (95.4 kg)  Height: 5' 6.6" (1.692 m)   Body mass index is 33.35 kg/m.   Objective:  Physical Exam Vitals signs and nursing note reviewed.  Constitutional:      General: She is not in acute distress.    Appearance: She is well-developed. She is not toxic-appearing.  HENT:     Head: Normocephalic and atraumatic.     Mouth/Throat:     Mouth: Mucous membranes are moist.  Eyes:     General: No visual field deficit or scleral icterus.    Extraocular Movements: Extraocular movements intact.     Pupils: Pupils are equal, round, and reactive to light. Pupils are equal.  Neck:     Musculoskeletal: Normal range of motion. No neck rigidity.  Cardiovascular:     Rate and Rhythm: Normal rate and regular rhythm.     Heart sounds: No murmur.  Pulmonary:     Effort: Pulmonary effort is normal.     Breath sounds: Normal breath sounds.  Musculoskeletal: Normal range of motion.  Lymphadenopathy:     Cervical: No cervical adenopathy.  Skin:    General: Skin is warm and dry.  Neurological:     Mental Status: She is alert and oriented to person, place, and time.     Cranial Nerves: No cranial nerve deficit or facial asymmetry.     Sensory: No sensory deficit.     Motor: No weakness.     Coordination: Romberg sign negative. Coordination normal.     Gait: Gait normal.     Deep Tendon Reflexes: Reflexes normal.  Psychiatric:        Mood and Affect: Mood normal.        Speech: Speech normal.        Behavior: Behavior normal.     Assessment And Plan:   1. Sinus headache- I placed her on Claritin D 24h and Flonase for 1 week as noted. I do not  believe she has a bacterial infection.  Advised to monitor her BP, and if elevated needs to be seen.  FU prn.      Margo Lama RODRIGUEZ-SOUTHWORTH, PA-C

## 2018-07-20 ENCOUNTER — Ambulatory Visit: Payer: BLUE CROSS/BLUE SHIELD | Admitting: Nurse Practitioner

## 2018-07-30 ENCOUNTER — Encounter (HOSPITAL_COMMUNITY): Payer: Self-pay | Admitting: Emergency Medicine

## 2018-07-30 ENCOUNTER — Emergency Department (HOSPITAL_COMMUNITY): Payer: BLUE CROSS/BLUE SHIELD

## 2018-07-30 ENCOUNTER — Emergency Department (HOSPITAL_COMMUNITY)
Admission: EM | Admit: 2018-07-30 | Discharge: 2018-07-30 | Disposition: A | Discharge status: Home / Self Care | Payer: BLUE CROSS/BLUE SHIELD | Attending: Emergency Medicine | Admitting: Emergency Medicine

## 2018-07-30 ENCOUNTER — Other Ambulatory Visit: Payer: Self-pay

## 2018-07-30 DIAGNOSIS — F410 Panic disorder [episodic paroxysmal anxiety] without agoraphobia: Secondary | ICD-10-CM | POA: Diagnosis not present

## 2018-07-30 DIAGNOSIS — R0602 Shortness of breath: Secondary | ICD-10-CM | POA: Diagnosis not present

## 2018-07-30 DIAGNOSIS — R531 Weakness: Secondary | ICD-10-CM | POA: Insufficient documentation

## 2018-07-30 LAB — CBC WITH DIFFERENTIAL/PLATELET
Abs Immature Granulocytes: 0 10*3/uL (ref 0.00–0.07)
Band Neutrophils: 1 %
Basophils Absolute: 0 10*3/uL (ref 0.0–0.1)
Basophils Relative: 0 %
Eosinophils Absolute: 0 10*3/uL (ref 0.0–0.5)
Eosinophils Relative: 0 %
HCT: 33 % — ABNORMAL LOW (ref 36.0–46.0)
Hemoglobin: 10.4 g/dL — ABNORMAL LOW (ref 12.0–15.0)
Lymphocytes Relative: 27 %
Lymphs Abs: 2.9 10*3/uL (ref 0.7–4.0)
MCH: 20.1 pg — ABNORMAL LOW (ref 26.0–34.0)
MCHC: 31.5 g/dL (ref 30.0–36.0)
MCV: 63.7 fL — ABNORMAL LOW (ref 80.0–100.0)
Monocytes Absolute: 0.1 10*3/uL (ref 0.1–1.0)
Monocytes Relative: 1 %
Neutro Abs: 7.8 10*3/uL — ABNORMAL HIGH (ref 1.7–7.7)
Neutrophils Relative %: 71 %
Platelets: 286 10*3/uL (ref 150–400)
RBC: 5.18 MIL/uL — ABNORMAL HIGH (ref 3.87–5.11)
RDW: 15.7 % — ABNORMAL HIGH (ref 11.5–15.5)
WBC: 10.9 10*3/uL — ABNORMAL HIGH (ref 4.0–10.5)
nRBC: 0 % (ref 0.0–0.2)
nRBC: 0 /100 WBC

## 2018-07-30 LAB — BASIC METABOLIC PANEL
Anion gap: 8 (ref 5–15)
BUN: 11 mg/dL (ref 6–20)
CO2: 21 mmol/L — ABNORMAL LOW (ref 22–32)
Calcium: 8.7 mg/dL — ABNORMAL LOW (ref 8.9–10.3)
Chloride: 107 mmol/L (ref 98–111)
Creatinine, Ser: 1.01 mg/dL — ABNORMAL HIGH (ref 0.44–1.00)
GFR calc Af Amer: >60 mL/min (ref >60)
GFR calc non Af Amer: >60 mL/min (ref >60)
Glucose, Bld: 149 mg/dL — ABNORMAL HIGH (ref 70–99)
Potassium: 3.1 mmol/L — ABNORMAL LOW (ref 3.5–5.1)
Sodium: 136 mmol/L (ref 135–145)

## 2018-07-30 LAB — URINALYSIS, ROUTINE W REFLEX MICROSCOPIC
Bacteria, UA: NONE SEEN
Bilirubin Urine: NEGATIVE
Glucose, UA: NEGATIVE mg/dL
Hgb urine dipstick: NEGATIVE
Ketones, ur: NEGATIVE mg/dL
Nitrite: NEGATIVE
Protein, ur: NEGATIVE mg/dL
Specific Gravity, Urine: 1.019 (ref 1.005–1.030)
pH: 5 (ref 5.0–8.0)

## 2018-07-30 LAB — D-DIMER, QUANTITATIVE: D-Dimer, Quant: 0.77 ug/mL-FEU — ABNORMAL HIGH (ref 0.00–0.50)

## 2018-07-30 LAB — I-STAT TROPONIN, ED: Troponin i, poc: 0 ng/mL (ref 0.00–0.08)

## 2018-07-30 LAB — BRAIN NATRIURETIC PEPTIDE: B Natriuretic Peptide: 17.4 pg/mL (ref 0.0–100.0)

## 2018-07-30 LAB — PREGNANCY, URINE: Preg Test, Ur: NEGATIVE

## 2018-07-30 MED ORDER — IOHEXOL 350 MG/ML SOLN
75.0000 mL | Freq: Once | INTRAVENOUS | Status: AC | PRN
  Administered 2018-07-30: 75 mL via INTRAVENOUS

## 2018-07-30 MED ORDER — LORAZEPAM 0.5 MG PO TABS
0.2500 mg | ORAL_TABLET | Freq: Once | ORAL | Status: AC
  Administered 2018-07-30: 0.25 mg via ORAL
  Filled 2018-07-30: qty 1

## 2018-07-30 NOTE — ED Triage Notes (Signed)
Pt coming in from home after stating about 40 minutes ago she was laying on the couch and started developing shob. Took her temp and it was 99.5. Works as [employer] fundraiser in [employer]

## 2018-07-30 NOTE — ED Notes (Signed)
Pt back from CT

## 2018-07-30 NOTE — ED Notes (Signed)
Pt stating she feels better and is currently not having anymore shob

## 2018-07-30 NOTE — ED Notes (Signed)
Pt is tachy and diaphoretic, states, "I just don't feel good" anxiously. PA informed and will continue to monitor

## 2018-07-30 NOTE — ED Notes (Signed)
Patient transported to CT 

## 2018-07-30 NOTE — Discharge Instructions (Addendum)
Your work up was negative for any abnormalities. Please return for any new or worsening symptoms  Get help right away if: Your shortness of breath gets worse. You have shortness of breath when you are resting. You feel light-headed or you faint. You have a cough that is not controlled with medicines. You cough up blood. You have pain with breathing. You have pain in your chest, arms, shoulders, or abdomen. You have a fever. You cannot walk up stairs or exercise the way that you normally do.

## 2018-07-30 NOTE — ED Provider Notes (Signed)
[facility] EMERGENCY DEPARTMENT Provider Note   CSN: [id number] Arrival date & time: 07/30/18  1904    History   Chief Complaint Chief Complaint  Patient presents with  . Shortness of Breath    HPI Charlene Evans is a 48 y.o. female.  Who presents the emergency department chief complaint of shortness of breath.  Patient states that she got off work and had onset of shortness of breath while sitting on the couch.  She had an overwhelming sense of dread and felt her heart racing.  She took her temperature and thought she might have the coronavirus and her symptoms became worse.  She came to the emergency department.  She states she just feels "weak all over now."  She denies fevers, chills, hemoptysis, cough, exposure to patients with known coronavirus.  She denies chest pain unilateral leg swelling, use of exogenous estrogens or smoking.     HPI  Past Medical History:  Diagnosis Date  . Anemia     There are no active problems to display for this patient.   No past surgical history on file.   OB History   No obstetric history on file.      Home Medications    Prior to Admission medications   Not on File    Family History Family History  Problem Relation Age of Onset  . Diabetes Mother   . Hypertension Mother   . Cancer Father   . Cancer Maternal Grandmother   . Diabetes Maternal Grandmother   . Cancer Maternal Grandfather   . Diabetes Maternal Grandfather   . Hypertension Maternal Grandfather     Social History Social History   Tobacco Use  . Smoking status: Never Smoker  . Smokeless tobacco: Never Used  Substance Use Topics  . Alcohol use: No    Alcohol/week: 0.0 standard drinks  . Drug use: No     Allergies   Patient has no known allergies.   Review of Systems Review of Systems Ten systems reviewed and are negative for acute change, except as noted in the HPI.   Physical Exam Updated Vital Signs BP (!) 167/84   Pulse  (!) 107   Temp 98.2 F (36.8 C) (Oral)   Resp (!) 22   Ht 5\' 7"  (1.702 m)   Wt 95.3 kg   SpO2 100%   BMI 32.89 kg/m   Physical Exam Physical Exam  Nursing note and vitals reviewed. Constitutional: She is oriented to person, place, and time. She appears well-developed and well-nourished. No distress.  HENT:  Head: Normocephalic and atraumatic.  Eyes: Conjunctivae normal and EOM are normal. Pupils are equal, round, and reactive to light. No scleral icterus.  Neck: Normal range of motion.  Cardiovascular: Normal rate, regular rhythm and normal heart sounds.  Exam reveals no gallop and no friction rub.   No murmur heard. Pulmonary/Chest: Effort normal and breath sounds normal. No respiratory distress.  Abdominal: Soft. Bowel sounds are normal. She exhibits no distension and no mass. There is no tenderness. There is no guarding.  Neurological: She is alert and oriented to person, place, and time.  Skin: Skin is warm and dry. She is not diaphoretic.  Psych: Anxious   ED Treatments / Results  Labs (all labs ordered are listed, but only abnormal results are displayed) Labs Reviewed  BASIC METABOLIC PANEL - Abnormal; Notable for the following components:      Result Value   Potassium 3.1 (*)    CO2  21 (*)    Glucose, Bld 149 (*)    Creatinine, Ser 1.01 (*)    Calcium 8.7 (*)    All other components within normal limits  CBC WITH DIFFERENTIAL/PLATELET - Abnormal; Notable for the following components:   WBC 10.9 (*)    RBC 5.18 (*)    Hemoglobin 10.4 (*)    HCT 33.0 (*)    MCV 63.7 (*)    MCH 20.1 (*)    RDW 15.7 (*)    All other components within normal limits  D-DIMER, QUANTITATIVE (NOT AT [facility]) - Abnormal; Notable for the following components:   D-Dimer, Quant 0.77 (*)    All other components within normal limits  BRAIN NATRIURETIC PEPTIDE  URINALYSIS, ROUTINE W REFLEX MICROSCOPIC  PREGNANCY, URINE  I-STAT TROPONIN, ED    EKG EKG Interpretation  Date/Time:  Monday  July 30 2018 19:27:05 EDT Ventricular Rate:  114 PR Interval:    QRS Duration: 84 QT Interval:  317 QTC Calculation: 437 R Axis:   30 Text Interpretation:  Sinus tachycardia Prominent P waves, nondiagnostic Borderline abnrm T, anterolateral leads poor baseline, likely similar to prior 12/01 Confirmed by Meridee Score [phone]) on 07/30/2018 7:37:07 PM   Radiology Dg Chest Port 1 View  Result Date: 07/30/2018 CLINICAL DATA:  Shortness of breath EXAM: PORTABLE CHEST 1 VIEW COMPARISON:  None. FINDINGS: Lungs are clear. Heart size is upper normal with pulmonary vascularity normal. No adenopathy. No bone lesions. IMPRESSION: No edema or consolidation. Electronically Signed   By: Bretta Bang III M.D.   On: 07/30/2018 19:54    Procedures Procedures (including critical care time)  Medications Ordered in ED Medications  LORazepam (ATIVAN) tablet 0.25 mg (0.25 mg Oral Given 07/30/18 1953)     Initial Impression / Assessment and Plan / ED Course  I have reviewed the triage vital signs and the nursing notes.  Pertinent labs & imaging results that were available during my care of the patient were reviewed by me and considered in my medical decision making (see chart for details).        CC:sob VS: BP (!) 141/92 (BP Location: Right Arm)   Pulse 94   Temp 98.3 F (36.8 C) (Oral)   Resp (!) 21   Ht  (1.702 m)   Wt 95.3 kg   SpO2 100%   BMI 32.89 kg/m  UE:[id number] is gathered by the patient and nursing notes. DDX:The emergent differential diagnosis for shortness of breath includes, but is not limited to, Pulmonary edema, bronchoconstriction, Pneumonia, Pulmonary embolism, Pneumotherax/ Hemothorax, Dysrythmia, ACS.  Labs: I reviewed the labs which show normal troponin, normal BNP, mild anemia, slightly elevated white blood cell count.  Urine appears contaminated without evidence of infectionTroponin negative, pregnancy negative.  Patient potassium is slightly low dimer is elevated.  Imaging: I personally reviewed the images (1 view chest x-ray and a CT Angie of the chest.) which show(s) no acute abnormalities putting infection or pulmonary embolus EKG: Sinus tachycardia unchanged from prior examination MDM: Patient here with shortness of breath.  She is very anxious.  This is all likely panic attack in the setting of significant stress and global pandemic. Patient disposition: Discharge home Patient condition: Excellent. The patient appears reasonably screened and/or stabilized for discharge and I doubt any other medical condition or other [facility] requiring further screening, evaluation, or treatment in the ED at this time prior to discharge. I have discussed lab and/or imaging findings with the patient and answered all questions/concerns to  the best of my ability. I have discussed return precautions and OP follow up.      Final Clinical Impressions(s) / ED Diagnoses   Final diagnoses:  None    ED Discharge Orders    None       Arthor Captain, PA-C 07/31/18 0010    Terrilee Files, MD 07/31/18 1401

## 2018-08-01 ENCOUNTER — Other Ambulatory Visit: Payer: Self-pay

## 2018-08-01 ENCOUNTER — Ambulatory Visit (INDEPENDENT_AMBULATORY_CARE_PROVIDER_SITE_OTHER): Payer: BLUE CROSS/BLUE SHIELD | Admitting: Nurse Practitioner

## 2018-08-01 ENCOUNTER — Encounter: Payer: Self-pay | Admitting: Nurse Practitioner

## 2018-08-01 VITALS — BP 140/70 | HR 76 | Temp 98.3°F | Ht 67.0 in | Wt 206.8 lb

## 2018-08-01 DIAGNOSIS — Z09 Encounter for follow-up examination after completed treatment for conditions other than malignant neoplasm: Secondary | ICD-10-CM

## 2018-08-01 DIAGNOSIS — F419 Anxiety disorder, unspecified: Secondary | ICD-10-CM | POA: Insufficient documentation

## 2018-08-01 DIAGNOSIS — D569 Thalassemia, unspecified: Secondary | ICD-10-CM

## 2018-08-01 MED ORDER — FOLIC ACID 1 MG PO TABS: 1.0000 mg | ORAL_TABLET | Freq: Every day | ORAL | 1 refills | Status: DC

## 2018-08-01 NOTE — Progress Notes (Signed)
Subjective:     Patient ID: Charlene Evans , female    DOB: [birth year] , [age] y.o.   MRN: [id number]   Chief Complaint  Patient presents with  . Hospitalization Follow-up  . Anxiety    HPI  Here for ER follow up - she had been working and when she went home and suddenly had shortness of breath, chest pain Works in [employer] - she is currently out of work until 16th but feels she can go back before then.   Left arm and midchest still does not feel quite right.    Anxiety  Presents for initial visit. Onset was in the past 7 days. The problem has been rapidly improving. Symptoms include palpitations and shortness of breath. Patient reports no chest pain, dizziness, excessive worry or nervous/anxious behavior. The quality of sleep is good. Nighttime awakenings: none.   Her past medical history is significant for anxiety/panic attacks. There is no history of anemia. Past treatments include nothing.     Past Medical History:  Diagnosis Date  . Anemia      Family History  Problem Relation Age of Onset  . Diabetes Mother   . Hypertension Mother   . Cancer Father   . Cancer Maternal Grandmother   . Diabetes Maternal Grandmother   . Cancer Maternal Grandfather   . Diabetes Maternal Grandfather   . Hypertension Maternal Grandfather     No current outpatient medications on file.   No Known Allergies   Review of Systems  Constitutional: Negative for fatigue.  Respiratory: Positive for shortness of breath. Negative for cough and wheezing.   Cardiovascular: Positive for palpitations. Negative for chest pain.  Endocrine: Negative for polydipsia, polyphagia and polyuria.  Neurological: Negative for dizziness and headaches.  Psychiatric/Behavioral: The patient is not nervous/anxious.      Today's Vitals   08/01/18 1458  BP: 140/70  Pulse: 76  Temp: 98.3 F (36.8 C)  TempSrc: Oral  SpO2: 98%  Weight: 206 lb 12.8 oz (93.8 kg)  Height: 5\' 7"  (1.702 m)   Body mass index is  32.39 kg/m.   Objective:  Physical Exam Vitals signs reviewed.  Constitutional:      Appearance: Normal appearance.  Cardiovascular:     Rate and Rhythm: Normal rate and regular rhythm.     Pulses: Normal pulses.     Heart sounds: Normal heart sounds.  Pulmonary:     Effort: Pulmonary effort is normal. No respiratory distress.     Breath sounds: Normal breath sounds.  Neurological:     General: No focal deficit present.     Mental Status: She is alert and oriented to person, place, and time.  Psychiatric:        Mood and Affect: Mood normal.        Behavior: Behavior normal.        Thought Content: Thought content normal.        Judgment: Judgment normal.         Assessment And Plan:     1. 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  She does not feel she needs any medications at this time  2. Thalassemia, unspecified type  Some of the symptoms she is having also occurs with thalassemia to include anxiety, fatigue, chest discomfort.  Will try her on folic acid.  - Vitamin B12 - RBC Folate - folic acid (FOLVITE) 1  MG tablet; Take 1 tablet (1 mg total) by mouth daily.  Dispense: 90 tablet; Refill: 1    Arnette Felts, FNP    THE PATIENT IS ENCOURAGED TO PRACTICE SOCIAL DISTANCING DUE TO THE COVID-19 PANDEMIC.

## 2018-08-01 NOTE — Patient Instructions (Signed)
Thalassemia    Thalassemia is a blood disorder that causes a low level of red blood cells (anemia). This condition is passed from parent to child through abnormal genes (gene mutations). The mutations make it hard for your body to make the protein in red blood cells (hemoglobin) that carries oxygen from your lungs to the rest of your body. Red blood cells do not live long without hemoglobin. Loss of red blood cells leads to anemia, which is the main symptom of thalassemia.  There are two main types of thalassemia. The type depends on which part of the hemoglobin is affected.  · Alpha thalassemia affects the alpha part of the hemoglobin. This is caused by four genes. You could get two from each parent.  · Beta thalassemia affects the beta part of the hemoglobin. This is caused by two genes. You could get one from each parent.  Thalassemia can be mild or severe. It depends on how many gene mutations you are born with. The more gene mutations you get, the more severe the condition. A person who inherits just one gene will be a carrier of the condition (thalassemia trait). A person with thalassemia trait may not have any symptoms or may have only mild anemia. A person who inherits two or more genes can have thalassemia minor, thalassemia intermedia, or thalassemia major.  Thalassemia is a lifelong condition. There is no cure, but treatment can control symptoms and manage the condition.  What are the causes?  Thalassemia is cause by gene mutations that are passed down through families.  What increases the risk?  You are more likely to develop this condition if:  · You have a family history of thalassemia.   · Your ancestors are from [place], [place], [place], [place], [place], or the [place].  What are the signs or symptoms?  The most common signs and symptoms of thalassemia are the signs and symptoms of anemia. They include:  · Weakness.  · Tiredness.  · Pounding heartbeat.  · Dizziness.  · Headache.  · Leg  cramps.  · Pale skin.  · Confusion.  · Shortness of breath.  Other signs and symptoms can also occur. You may have:  · Yellow eyes or skin, and dark urine (jaundice). The breakdown of red blood cells can cause a yellowing pigment (bilirubin) to build up in your blood.  · Weak bones (osteoporosis) and bone fractures. This is because bones can weaken from the effort of making more hemoglobin.  · An enlarged spleen. This can lead to a swollen belly. Your spleen can become enlarged from filtering dead red blood cells.  · Frequent, severe infections. This occurs if your spleen and bone marrow become weak. These organs make white blood cells that your body needs to fight infections.  How is this diagnosed?  Your health care provider may suspect thalassemia based on your signs and symptoms, especially if you have a family history of the condition. This condition may be diagnosed:  · In childhood, if you have severe forms of thalassemia. This is because symptoms show early in life.  · At birth. In the [place] babies are screened for this condition.  · In adulthood, if you have thalassemia trait or thalassemia minor. This happens if symptoms of anemia start or if a routine blood test shows unexplained anemia.  Blood tests can confirm a thalassemia diagnosis. Blood tests may show:  · Low hemoglobin.  · Low iron.  · Abnormal hemoglobin.  · Thalassemia gene mutations.  You   may need to see a health care provider who specializes in blood diseases (hematologist).  How is this treated?  Treatment for this condition depends on the type of thalassemia that you have:  · If you have thalassemia trait or thalassemia minor, you may not need treatment. However, you may need treatment if you have thalassemia minor and you develop symptoms during an infection.  · If you have thalassemia intermedia, you will have symptoms that require treatment.  · If you have major thalassemia, you will have serious symptoms that require regular  treatment.  Thalassemia treatment may include:  · Donated blood (transfusions) to replace red blood cells.  · Vitamin B (folic acid) supplements to help produce hemoglobin and red blood cells.  · Medicines or injections to remove iron buildup (chelation). This can happen in people who have frequent transfusions. Iron overload can damage heart, liver, and brain cells.  · In the case of severe thalassemia:  ? The spleen may need to be removed if it becomes damaged.  ? Stem cell or bone marrow transplants may be necessary to transplant cells that can make red blood cells. This may be done if transfusions are not working.  Follow these instructions at home:  Eating and drinking    · Follow instructions from your health care provider about eating or drinking restrictions. You may need to avoid foods or drinks that are high in iron or fortified with iron.  · Eat foods that are high in fiber, such as fresh fruits and vegetables, whole grains, and beans. Limit foods that are high in fat and processed sugars, such as fried and sweet foods. Nutrition is important for preventing anemia.  Activity  · Return to your normal activities as told by your health care provider. Ask your health care provider what activities are safe for you.  · Exercise is important for maintaining energy and strong bones. Ask your health care provider what amount and type of exercise is safe for you.  General instructions    · Take over-the-counter and prescription medicines only as told by your health care provider.  · Keep all routine vaccinations and flu shots up to date to reduce your risk of infection.  · Wash your hands frequently.  · Do your best to avoid sick people, and stay out of crowds during cold and flu seasons.  · Meet with a genetic counselor if you are or may become pregnant. A genetic counselor can explain the risks of passing thalassemia to a child.  · Keep all follow-up visits as told by your health care provider. This is  important.  Contact a health care provider if:  · You have signs or symptoms of anemia.  · You have a fever or other signs of infection.  · Your belly is swollen.  · You have jaundice.  Get help right away if:  · You feel very weak or short of breath.  Summary  · Thalassemia is a blood disorder that causes anemia.  · Thalassemia can range from mild to severe.  · This condition is passed down through families.  · There is no cure, but treatment can manage the symptoms and prevent anemia.  This information is not intended to replace advice given to you by your health care provider. Make sure you discuss any questions you have with your health care provider.  Document Released: 08/08/2017 Document Revised: 08/08/2017 Document Reviewed: 08/08/2017  [employer]vier Interactive Patient Education © 2019 [employer]

## 2018-08-02 LAB — FOLATE RBC
Folate, Hemolysate: 298 ng/mL
Folate, RBC: 823 ng/mL (ref >498)
Hematocrit: 36.2 % (ref 34.0–46.6)

## 2018-08-02 LAB — VITAMIN B12: Vitamin B-12: 1095 pg/mL (ref 232–1245)

## 2018-08-19 ENCOUNTER — Encounter: Payer: Self-pay | Admitting: Nurse Practitioner

## 2018-11-22 ENCOUNTER — Encounter: Payer: BLUE CROSS/BLUE SHIELD | Admitting: Nurse Practitioner

## 2019-02-27 ENCOUNTER — Other Ambulatory Visit: Payer: Self-pay

## 2019-02-27 DIAGNOSIS — Z20822 Contact with and (suspected) exposure to covid-19: Secondary | ICD-10-CM

## 2019-02-28 LAB — NOVEL CORONAVIRUS, NAA: SARS-CoV-2, NAA: NOT DETECTED

## 2019-03-01 ENCOUNTER — Other Ambulatory Visit: Payer: Self-pay

## 2019-03-01 DIAGNOSIS — Z20822 Contact with and (suspected) exposure to covid-19: Secondary | ICD-10-CM

## 2019-03-03 LAB — NOVEL CORONAVIRUS, NAA: SARS-CoV-2, NAA: NOT DETECTED

## 2019-03-12 ENCOUNTER — Telehealth (INDEPENDENT_AMBULATORY_CARE_PROVIDER_SITE_OTHER): Payer: BC Managed Care – PPO | Admitting: Nurse Practitioner

## 2019-03-12 ENCOUNTER — Other Ambulatory Visit: Payer: Self-pay

## 2019-03-12 ENCOUNTER — Encounter: Payer: Self-pay | Admitting: Nurse Practitioner

## 2019-03-12 DIAGNOSIS — R5383 Other fatigue: Secondary | ICD-10-CM

## 2019-03-12 DIAGNOSIS — R519 Headache, unspecified: Secondary | ICD-10-CM

## 2019-03-12 DIAGNOSIS — Z20822 Contact with and (suspected) exposure to covid-19: Secondary | ICD-10-CM

## 2019-03-12 DIAGNOSIS — M791 Myalgia, unspecified site: Secondary | ICD-10-CM

## 2019-03-12 DIAGNOSIS — R509 Fever, unspecified: Secondary | ICD-10-CM

## 2019-03-12 DIAGNOSIS — Z20828 Contact with and (suspected) exposure to other viral communicable diseases: Secondary | ICD-10-CM

## 2019-03-12 MED ORDER — AZITHROMYCIN 250 MG PO TABS: ORAL_TABLET | ORAL | 0 refills | Status: AC

## 2019-03-12 NOTE — Progress Notes (Signed)
Virtual Visit via Video   This visit type was conducted due to national recommendations for restrictions regarding the COVID-19 Pandemic (e.g. social distancing) in an effort to limit this patient's exposure and mitigate transmission in our community.  Due to her co-morbid illnesses, this patient is at least at moderate risk for complications without adequate follow up.  This format is felt to be most appropriate for this patient at this time.  All issues noted in this document were discussed and addressed.  A limited physical exam was performed with this format.    This visit type was conducted due to national recommendations for restrictions regarding the COVID-19 Pandemic (e.g. social distancing) in an effort to limit this patient's exposure and mitigate transmission in our community.  Patients identity confirmed using two different identifiers.  This format is felt to be most appropriate for this patient at this time.  All issues noted in this document were discussed and addressed.  No physical exam was performed (except for noted visual exam findings with Video Visits).    Date:  03/18/2019   ID:  Charlene Evans, DOB 1971/03/10, MRN [id number]  Patient Location:  In her car - spoke with Avie Arenas  Provider location:   Office    Chief Complaint:  Body aches, chills  History of Present Illness:    Charlene Evans is a 48 y.o. female who presents via video conferencing for a telehealth visit today.    The patient does have symptoms concerning for COVID-19 infection (fever, chills, cough, or new shortness of breath).   She has been at work the weekend, she worked Friday she then had chills and Fever. She went in Saturday and Sunday.  She is mostly aching.  No fever since Friday was up to 100.5.  Her daughter tested positive on Oct 31st and lives with her. She also works at [employer]  Negative cough, mostly sinus drainage.   URI  Associated symptoms include headaches. Pertinent  negatives include no wheezing.     Past Medical History:  Diagnosis Date  . Anemia    History reviewed. No pertinent surgical history.   Current Meds  Medication Sig  . folic acid (FOLVITE) 1 MG tablet Take 1 tablet (1 mg total) by mouth daily.     Allergies:   Patient has no known allergies.   Social History   Tobacco Use  . Smoking status: Never Smoker  . Smokeless tobacco: Never Used  Substance Use Topics  . Alcohol use: No    Alcohol/week: 0.0 standard drinks  . Drug use: No     Family Hx: The patient's family history includes Cancer in her father, maternal grandfather, and maternal grandmother; Diabetes in her maternal grandfather, maternal grandmother, and mother; Hypertension in her maternal grandfather and mother.  ROS:   Please see the history of present illness.    Review of Systems  Constitutional: Positive for chills, fever and malaise/fatigue.  Respiratory: Positive for shortness of breath (winded). Negative for wheezing.   Cardiovascular: Negative.   Neurological: Positive for headaches. Negative for dizziness.  Psychiatric/Behavioral: Negative.     All other systems reviewed and are negative.   Labs/Other Tests and Data Reviewed:    Recent Labs: 07/30/2018: B Natriuretic Peptide 17.4; BUN 11; Creatinine, Ser 1.01; Hemoglobin 10.4; Platelets 286; Potassium 3.1; Sodium 136   Recent Lipid Panel No results found for: CHOL, TRIG, HDL, CHOLHDL, LDLCALC, LDLDIRECT  Wt Readings from Last 3 Encounters:  08/01/18 206 lb 12.8  oz (93.8 kg)  07/30/18 210 lb (95.3 kg)  05/22/18 210 lb 6.4 oz (95.4 kg)     Exam:    Vital Signs:  There were no vitals taken for this visit.    Physical Exam  Constitutional: She is oriented to person, place, and time and well-developed, well-nourished, and in no distress. No distress.  Neurological: She is alert and oriented to person, place, and time.  Psychiatric: Mood, memory, affect and judgment normal.    ASSESSMENT &  PLAN:    1. Fatigue, unspecified type  Persistent fatigue and exposure to coronavirus she is already at [facility] to have a coronavirus test  She is to quarantine until her results return  She is to go to the ER for evaluation if she is having shortness of breath - azithromycin (ZITHROMAX) 250 MG tablet; Take 2 tablets (500 mg) on  Day 1,  followed by 1 tablet (250 mg) once daily on Days 2 through 5.  Dispense: 6 each; Refill: 0  2. Close exposure to COVID-19 virus  Her daughter was positive for coronavirus on October 31st and the patient works at [employer].    COVID-19 Education: The signs and symptoms of COVID-19 were discussed with the patient and how to seek care for testing (follow up with PCP or arrange E-visit).  The importance of social distancing was discussed today.  Patient Risk:   After full review of this patients clinical status, I feel that they are at least moderate risk at this time.  Time:   Today, I have spent 13 minutes/ seconds with the patient with telehealth technology discussing above diagnoses.     Medication Adjustments/Labs and Tests Ordered: Current medicines are reviewed at length with the patient today.  Concerns regarding medicines are outlined above.   Tests Ordered: No orders of the defined types were placed in this encounter.   Medication Changes: Meds ordered this encounter  Medications  . azithromycin (ZITHROMAX) 250 MG tablet    Sig: Take 2 tablets (500 mg) on  Day 1,  followed by 1 tablet (250 mg) once daily on Days 2 through 5.    Dispense:  6 each    Refill:  0    Disposition:  Follow up prn  Signed, Arnette Felts, FNP

## 2019-03-14 LAB — NOVEL CORONAVIRUS, NAA: SARS-CoV-2, NAA: DETECTED — AB

## 2019-03-19 ENCOUNTER — Encounter: Payer: Self-pay | Admitting: *Deleted

## 2019-03-19 ENCOUNTER — Other Ambulatory Visit: Payer: Self-pay | Admitting: Nurse Practitioner

## 2019-03-19 ENCOUNTER — Telehealth: Payer: Self-pay | Admitting: Nurse Practitioner

## 2019-03-19 NOTE — Telephone Encounter (Signed)
Spoke to patient who is positive for coronavirus on November 17th, my recommendation is for her to remain quarantined for a total of 14 days and be retested to ensure she has a negative test.

## 2019-03-20 ENCOUNTER — Telehealth: Payer: Self-pay

## 2019-03-20 NOTE — Telephone Encounter (Signed)
Called to check on pt vm full not able to leave message

## 2019-04-05 ENCOUNTER — Other Ambulatory Visit: Payer: Self-pay

## 2019-04-05 DIAGNOSIS — Z20822 Contact with and (suspected) exposure to covid-19: Secondary | ICD-10-CM

## 2019-04-06 LAB — NOVEL CORONAVIRUS, NAA: SARS-CoV-2, NAA: NOT DETECTED

## 2019-04-15 ENCOUNTER — Ambulatory Visit: Payer: BC Managed Care – PPO | Attending: Internal Medicine

## 2019-04-15 DIAGNOSIS — Z20822 Contact with and (suspected) exposure to covid-19: Secondary | ICD-10-CM

## 2019-04-16 LAB — NOVEL CORONAVIRUS, NAA: SARS-CoV-2, NAA: NOT DETECTED

## 2019-04-22 ENCOUNTER — Ambulatory Visit: Payer: BC Managed Care – PPO | Attending: Internal Medicine

## 2019-04-22 DIAGNOSIS — Z20822 Contact with and (suspected) exposure to covid-19: Secondary | ICD-10-CM

## 2019-04-24 ENCOUNTER — Other Ambulatory Visit: Payer: BC Managed Care – PPO

## 2019-04-24 LAB — NOVEL CORONAVIRUS, NAA: SARS-CoV-2, NAA: NOT DETECTED

## 2019-04-29 ENCOUNTER — Other Ambulatory Visit: Payer: BC Managed Care – PPO

## 2019-05-01 ENCOUNTER — Ambulatory Visit: Payer: BC Managed Care – PPO | Attending: Internal Medicine

## 2019-05-01 DIAGNOSIS — Z20822 Contact with and (suspected) exposure to covid-19: Secondary | ICD-10-CM

## 2019-05-02 LAB — NOVEL CORONAVIRUS, NAA: SARS-CoV-2, NAA: NOT DETECTED

## 2019-05-21 ENCOUNTER — Ambulatory Visit: Payer: BC Managed Care – PPO | Attending: Internal Medicine

## 2019-05-21 DIAGNOSIS — Z20822 Contact with and (suspected) exposure to covid-19: Secondary | ICD-10-CM

## 2019-05-22 LAB — NOVEL CORONAVIRUS, NAA: SARS-CoV-2, NAA: NOT DETECTED

## 2019-05-29 ENCOUNTER — Ambulatory Visit: Payer: BC Managed Care – PPO | Attending: Internal Medicine

## 2019-05-29 DIAGNOSIS — Z20822 Contact with and (suspected) exposure to covid-19: Secondary | ICD-10-CM

## 2019-05-31 LAB — NOVEL CORONAVIRUS, NAA: SARS-CoV-2, NAA: NOT DETECTED

## 2019-06-06 ENCOUNTER — Other Ambulatory Visit: Payer: BC Managed Care – PPO

## 2019-06-10 ENCOUNTER — Ambulatory Visit: Payer: BC Managed Care – PPO | Attending: Internal Medicine

## 2019-06-10 DIAGNOSIS — Z20822 Contact with and (suspected) exposure to covid-19: Secondary | ICD-10-CM

## 2019-06-11 LAB — NOVEL CORONAVIRUS, NAA: SARS-CoV-2, NAA: NOT DETECTED

## 2019-06-24 ENCOUNTER — Ambulatory Visit: Payer: BC Managed Care – PPO | Attending: Internal Medicine

## 2019-06-24 ENCOUNTER — Other Ambulatory Visit: Payer: BC Managed Care – PPO

## 2019-06-24 DIAGNOSIS — Z20822 Contact with and (suspected) exposure to covid-19: Secondary | ICD-10-CM

## 2019-06-25 LAB — NOVEL CORONAVIRUS, NAA: SARS-CoV-2, NAA: NOT DETECTED

## 2019-07-03 ENCOUNTER — Ambulatory Visit: Payer: BC Managed Care – PPO | Attending: Internal Medicine

## 2019-07-03 DIAGNOSIS — Z20822 Contact with and (suspected) exposure to covid-19: Secondary | ICD-10-CM

## 2019-07-04 LAB — NOVEL CORONAVIRUS, NAA: SARS-CoV-2, NAA: NOT DETECTED

## 2019-07-25 ENCOUNTER — Other Ambulatory Visit: Payer: Self-pay

## 2019-07-25 ENCOUNTER — Ambulatory Visit: Payer: BC Managed Care – PPO | Admitting: Nurse Practitioner

## 2019-08-01 ENCOUNTER — Other Ambulatory Visit (HOSPITAL_COMMUNITY)
Admission: RE | Admit: 2019-08-01 | Discharge: 2019-08-01 | Disposition: A | Discharge status: Home / Self Care | Payer: BC Managed Care – PPO | Source: Ambulatory Visit | Attending: Nurse Practitioner | Admitting: Nurse Practitioner

## 2019-08-01 ENCOUNTER — Other Ambulatory Visit: Payer: Self-pay

## 2019-08-01 ENCOUNTER — Ambulatory Visit (INDEPENDENT_AMBULATORY_CARE_PROVIDER_SITE_OTHER): Payer: BC Managed Care – PPO | Admitting: Nurse Practitioner

## 2019-08-01 ENCOUNTER — Encounter: Payer: Self-pay | Admitting: Nurse Practitioner

## 2019-08-01 VITALS — BP 114/80 | HR 71 | Temp 98.3°F | Wt 213.0 lb

## 2019-08-01 DIAGNOSIS — Z124 Encounter for screening for malignant neoplasm of cervix: Secondary | ICD-10-CM | POA: Insufficient documentation

## 2019-08-01 DIAGNOSIS — Z113 Encounter for screening for infections with a predominantly sexual mode of transmission: Secondary | ICD-10-CM

## 2019-08-01 DIAGNOSIS — Z0000 Encounter for general adult medical examination without abnormal findings: Secondary | ICD-10-CM | POA: Diagnosis not present

## 2019-08-01 DIAGNOSIS — Z1211 Encounter for screening for malignant neoplasm of colon: Secondary | ICD-10-CM | POA: Diagnosis not present

## 2019-08-01 DIAGNOSIS — R5383 Other fatigue: Secondary | ICD-10-CM

## 2019-08-01 NOTE — Patient Instructions (Signed)
Health Maintenance, Female Adopting a healthy lifestyle and getting preventive care are important in promoting health and wellness. Ask your health care provider about:  The right schedule for you to have regular tests and exams.  Things you can do on your own to prevent diseases and keep yourself healthy. What should I know about diet, weight, and exercise? Eat a healthy diet   Eat a diet that includes plenty of vegetables, fruits, low-fat dairy products, and lean protein.  Do not eat a lot of foods that are high in solid fats, added sugars, or sodium. Maintain a healthy weight Body mass index (BMI) is used to identify weight problems. It estimates body fat based on height and weight. Your health care provider can help determine your BMI and help you achieve or maintain a healthy weight. Get regular exercise Get regular exercise. This is one of the most important things you can do for your health. Most adults should:  Exercise for at least 150 minutes each week. The exercise should increase your heart rate and make you sweat (moderate-intensity exercise).  Do strengthening exercises at least twice a week. This is in addition to the moderate-intensity exercise.  Spend less time sitting. Even light physical activity can be beneficial. Watch cholesterol and blood lipids Have your blood tested for lipids and cholesterol at 50 years of age, then have this test every 5 years. Have your cholesterol levels checked more often if:  Your lipid or cholesterol levels are high.  You are older than 50 years of age.  You are at high risk for heart disease. What should I know about cancer screening? Depending on your health history and family history, you may need to have cancer screening at various ages. This may include screening for:  Breast cancer.  Cervical cancer.  Colorectal cancer.  Skin cancer.  Lung cancer. What should I know about heart disease, diabetes, and high blood  pressure? Blood pressure and heart disease  High blood pressure causes heart disease and increases the risk of stroke. This is more likely to develop in people who have high blood pressure readings, are of African descent, or are overweight.  Have your blood pressure checked: ? Every 3-5 years if you are 18-39 years of age. ? Every year if you are 40 years old or older. Diabetes Have regular diabetes screenings. This checks your fasting blood sugar level. Have the screening done:  Once every three years after age 40 if you are at a normal weight and have a low risk for diabetes.  More often and at a younger age if you are overweight or have a high risk for diabetes. What should I know about preventing infection? Hepatitis B If you have a higher risk for hepatitis B, you should be screened for this virus. Talk with your health care provider to find out if you are at risk for hepatitis B infection. Hepatitis C Testing is recommended for:  Everyone born from 1945 through 1965.  Anyone with known risk factors for hepatitis C. Sexually transmitted infections (STIs)  Get screened for STIs, including gonorrhea and chlamydia, if: ? You are sexually active and are younger than 50 years of age. ? You are older than 50 years of age and your health care provider tells you that you are at risk for this type of infection. ? Your sexual activity has changed since you were last screened, and you are at increased risk for chlamydia or gonorrhea. Ask your health care provider if   you are at risk.  Ask your health care provider about whether you are at high risk for HIV. Your health care provider may recommend a prescription medicine to help prevent HIV infection. If you choose to take medicine to prevent HIV, you should first get tested for HIV. You should then be tested every 3 months for as long as you are taking the medicine. Pregnancy  If you are about to stop having your period (premenopausal) and  you may become pregnant, seek counseling before you get pregnant.  Take 400 to 800 micrograms (mcg) of folic acid every day if you become pregnant.  Ask for birth control (contraception) if you want to prevent pregnancy. Osteoporosis and menopause Osteoporosis is a disease in which the bones lose minerals and strength with aging. This can result in bone fractures. If you are 65 years old or older, or if you are at risk for osteoporosis and fractures, ask your health care provider if you should:  Be screened for bone loss.  Take a calcium or vitamin D supplement to lower your risk of fractures.  Be given hormone replacement therapy (HRT) to treat symptoms of menopause. Follow these instructions at home: Lifestyle  Do not use any products that contain nicotine or tobacco, such as cigarettes, e-cigarettes, and chewing tobacco. If you need help quitting, ask your health care provider.  Do not use street drugs.  Do not share needles.  Ask your health care provider for help if you need support or information about quitting drugs. Alcohol use  Do not drink alcohol if: ? Your health care provider tells you not to drink. ? You are pregnant, may be pregnant, or are planning to become pregnant.  If you drink alcohol: ? Limit how much you use to 0-1 drink a day. ? Limit intake if you are breastfeeding.  Be aware of how much alcohol is in your drink. In the [place] one drink equals one 12 oz bottle of beer (355 mL), one 5 oz glass of wine (148 mL), or one 1 oz glass of hard liquor (44 mL). General instructions  Schedule regular health, dental, and eye exams.  Stay current with your vaccines.  Tell your health care provider if: ? You often feel depressed. ? You have ever been abused or do not feel safe at home. Summary  Adopting a healthy lifestyle and getting preventive care are important in promoting health and wellness.  Follow your health care provider's instructions about healthy  diet, exercising, and getting tested or screened for diseases.  Follow your health care provider's instructions on monitoring your cholesterol and blood pressure. This information is not intended to replace advice given to you by your health care provider. Make sure you discuss any questions you have with your health care provider. Document Revised: 04/04/2018 Document Reviewed: 04/04/2018 Elsevier Patient Education  2020 Elsevier Inc.  

## 2019-08-01 NOTE — Progress Notes (Signed)
This visit occurred during the SARS-CoV-2 public health emergency.  Safety protocols were in place, including screening questions prior to the visit, additional usage of staff PPE, and extensive cleaning of exam room while observing appropriate contact time as indicated for disinfecting solutions.  Subjective:     Patient ID: Charlene Evans , female    DOB: 1970-12-26 , 49 y.o.   MRN: [id number]   Chief Complaint  Patient presents with  . Annual Exam  . Fatigue    HPI  Here for HM  She has not had covid vaccine.    The patient states she uses none for birth control. Last LMP was Patient's last menstrual period was 07/02/2019.. Negative for Dysmenorrhea and Negative for Menorrhagia Mammogram last done  Negative for: breast discharge, breast lump(s), breast pain and breast self exam.  Pertinent negatives include abnormal bleeding (hematology), anxiety, decreased libido, depression, difficulty falling sleep, dyspareunia, history of infertility, nocturia, sexual dysfunction, sleep disturbances, urinary incontinence, urinary urgency, vaginal discharge and vaginal itching. Diet regular. The patient states her exercise level is none.  The patient's tobacco use is:  Social History   Tobacco Use  Smoking Status Never Smoker  Smokeless Tobacco Never Used   She has been exposed to passive smoke. The patient's alcohol use is:  Social History   Substance and Sexual Activity  Alcohol Use No  . Alcohol/week: 0.0 standard drinks   Additional information: Last pap 2018, next one scheduled for today.    Past Medical History:  Diagnosis Date  . Anemia      Family History  Problem Relation Age of Onset  . Diabetes Mother   . Hypertension Mother   . Cancer Father   . Cancer Maternal Grandmother   . Diabetes Maternal Grandmother   . Cancer Maternal Grandfather   . Diabetes Maternal Grandfather   . Hypertension Maternal Grandfather      Current Outpatient Medications:  .  folic acid  (FOLVITE) 1 MG tablet, Take 1 tablet (1 mg total) by mouth daily., Disp: 90 tablet, Rfl: 1   No Known Allergies   Review of Systems  Constitutional: Negative.  Negative for fever.  HENT: Negative.   Eyes: Negative.   Respiratory: Negative.   Cardiovascular: Negative.  Negative for chest pain, palpitations and leg swelling.  Gastrointestinal: Negative.   Endocrine: Negative.   Genitourinary: Negative.   Musculoskeletal: Negative.   Skin: Negative.   Allergic/Immunologic: Negative.   Neurological: Negative.  Negative for dizziness and headaches.  Hematological: Negative.   Psychiatric/Behavioral: Negative.      Today's Vitals   08/01/19 0906  BP: 114/80  Pulse: 71  Temp: 98.3 F (36.8 C)  TempSrc: Oral  Weight: 213 lb (96.6 kg)  PainSc: 8   PainLoc: Back   Body mass index is 33.36 kg/m.   Objective:  Physical Exam Constitutional:      General: She is not in acute distress.    Appearance: Normal appearance. She is well-developed.  HENT:     Head: Normocephalic and atraumatic.     Right Ear: Hearing, tympanic membrane, ear canal and external ear normal. There is no impacted cerumen.     Left Ear: Hearing, tympanic membrane, ear canal and external ear normal. There is no impacted cerumen.     Nose:     Comments: Deferred - mask    Mouth/Throat:     Comments: Deferred - mask  Eyes:     General: Lids are normal.     Extraocular Movements:  Extraocular movements intact.     Conjunctiva/sclera: Conjunctivae normal.     Pupils: Pupils are equal, round, and reactive to light.     Funduscopic exam:    Right eye: No papilledema.        Left eye: No papilledema.  Neck:     Thyroid: No thyroid mass.     Vascular: No carotid bruit.  Cardiovascular:     Rate and Rhythm: Normal rate and regular rhythm.     Pulses: Normal pulses.     Heart sounds: Normal heart sounds. No murmur.  Pulmonary:     Effort: Pulmonary effort is normal. No respiratory distress.     Breath  sounds: Normal breath sounds.  Abdominal:     General: Abdomen is flat. Bowel sounds are normal. There is no distension.     Palpations: Abdomen is soft.  Musculoskeletal:        General: No swelling. Normal range of motion.     Cervical back: Full passive range of motion without pain, normal range of motion and neck supple.     Right lower leg: No edema.     Left lower leg: No edema.  Skin:    General: Skin is warm and dry.     Capillary Refill: Capillary refill takes less than 2 seconds.  Neurological:     General: No focal deficit present.     Mental Status: She is alert and oriented to person, place, and time.     Cranial Nerves: No cranial nerve deficit.     Sensory: No sensory deficit.  Psychiatric:        Mood and Affect: Mood normal.        Behavior: Behavior normal.        Thought Content: Thought content normal.        Judgment: Judgment normal.         Assessment And Plan:     1. Health maintenance examination . Behavior modifications discussed and diet history reviewed.   . Pt will continue to exercise regularly and modify diet with low GI, plant based foods and decrease intake of processed foods.  . Recommend intake of daily multivitamin, Vitamin D, and calcium.  . Recommend mammogram and colonoscopy for preventive screenings, as well as recommend immunizations that include influenza, TDAP (up to date) - CBC - Lipid panel - Hemoglobin A1c - CMP14+EGFR  2. Screening examination for STD (sexually transmitted disease)  - Cervicovaginal ancillary only - HIV antibody (with reflex)  3. Fatigue, unspecified type  This is chronic, she does work long hours encouraged to increase physical activity  High dose lipo b given in office  4. Encounter for Papanicolaou smear of cervix  Cervix is posterior - Cytology - PAP  5. Encounter for screening colonoscopy According to USPTF Colorectal cancer Screening guidelines. Colonoscopy is recommended every 10 years,  starting at age 47years. Will refer to GI for colon cancer screening.     Minette Brine, FNP    THE PATIENT IS ENCOURAGED TO PRACTICE SOCIAL DISTANCING DUE TO THE COVID-19 PANDEMIC.

## 2019-08-02 LAB — CBC
Hematocrit: 36.7 % (ref 34.0–46.6)
Hemoglobin: 11.1 g/dL (ref 11.1–15.9)
MCH: 19.5 pg — ABNORMAL LOW (ref 26.6–33.0)
MCHC: 30.2 g/dL — ABNORMAL LOW (ref 31.5–35.7)
MCV: 64 fL — ABNORMAL LOW (ref 79–97)
Platelets: 365 10*3/uL (ref 150–450)
RBC: 5.7 x10E6/uL — ABNORMAL HIGH (ref 3.77–5.28)
RDW: 19.1 % — ABNORMAL HIGH (ref 11.7–15.4)
WBC: 8.1 10*3/uL (ref 3.4–10.8)

## 2019-08-02 LAB — LIPID PANEL
Chol/HDL Ratio: 2.5 ratio (ref 0.0–4.4)
Cholesterol, Total: 161 mg/dL (ref 100–199)
HDL: 64 mg/dL (ref >39)
LDL Chol Calc (NIH): 85 mg/dL (ref 0–99)
Triglycerides: 57 mg/dL (ref 0–149)
VLDL Cholesterol Cal: 12 mg/dL (ref 5–40)

## 2019-08-02 LAB — CMP14+EGFR
ALT: 8 IU/L (ref 0–32)
AST: 14 IU/L (ref 0–40)
Albumin/Globulin Ratio: 1.3 (ref 1.2–2.2)
Albumin: 4.4 g/dL (ref 3.8–4.8)
Alkaline Phosphatase: 79 IU/L (ref 39–117)
BUN/Creatinine Ratio: 16 (ref 9–23)
BUN: 13 mg/dL (ref 6–24)
Bilirubin Total: 0.4 mg/dL (ref 0.0–1.2)
CO2: 21 mmol/L (ref 20–29)
Calcium: 9.4 mg/dL (ref 8.7–10.2)
Chloride: 104 mmol/L (ref 96–106)
Creatinine, Ser: 0.79 mg/dL (ref 0.57–1.00)
GFR calc Af Amer: 102 mL/min/{1.73_m2} (ref >59)
GFR calc non Af Amer: 88 mL/min/{1.73_m2} (ref >59)
Globulin, Total: 3.3 g/dL (ref 1.5–4.5)
Glucose: 91 mg/dL (ref 65–99)
Potassium: 4.2 mmol/L (ref 3.5–5.2)
Sodium: 139 mmol/L (ref 134–144)
Total Protein: 7.7 g/dL (ref 6.0–8.5)

## 2019-08-02 LAB — CERVICOVAGINAL ANCILLARY ONLY
Bacterial Vaginitis (gardnerella): NEGATIVE
Candida Glabrata: NEGATIVE
Candida Vaginitis: NEGATIVE
Chlamydia: NEGATIVE
Comment: NEGATIVE
Comment: NEGATIVE
Comment: NEGATIVE
Comment: NEGATIVE
Comment: NEGATIVE
Comment: NORMAL
Neisseria Gonorrhea: NEGATIVE
Trichomonas: NEGATIVE

## 2019-08-02 LAB — HEMOGLOBIN A1C
Est. average glucose Bld gHb Est-mCnc: 120 mg/dL
Hgb A1c MFr Bld: 5.8 % — ABNORMAL HIGH (ref 4.8–5.6)

## 2019-08-02 LAB — HIV ANTIBODY (ROUTINE TESTING W REFLEX): HIV Screen 4th Generation wRfx: NONREACTIVE

## 2019-08-05 LAB — CYTOLOGY - PAP
Comment: NEGATIVE
Diagnosis: NEGATIVE
High risk HPV: NEGATIVE

## 2019-08-26 ENCOUNTER — Other Ambulatory Visit: Payer: BC Managed Care – PPO

## 2019-10-07 ENCOUNTER — Ambulatory Visit: Payer: Self-pay | Attending: Internal Medicine

## 2019-10-07 DIAGNOSIS — Z20822 Contact with and (suspected) exposure to covid-19: Secondary | ICD-10-CM

## 2019-10-08 LAB — SARS-COV-2, NAA 2 DAY TAT

## 2019-10-08 LAB — NOVEL CORONAVIRUS, NAA: SARS-CoV-2, NAA: NOT DETECTED

## 2019-10-21 ENCOUNTER — Ambulatory Visit: Payer: Self-pay | Attending: Internal Medicine

## 2019-10-21 DIAGNOSIS — Z20822 Contact with and (suspected) exposure to covid-19: Secondary | ICD-10-CM

## 2019-10-22 LAB — SARS-COV-2, NAA 2 DAY TAT

## 2019-10-22 LAB — NOVEL CORONAVIRUS, NAA: SARS-CoV-2, NAA: NOT DETECTED

## 2019-11-18 ENCOUNTER — Other Ambulatory Visit: Payer: Self-pay

## 2019-11-20 ENCOUNTER — Other Ambulatory Visit: Payer: Self-pay

## 2019-11-27 ENCOUNTER — Telehealth: Payer: Self-pay

## 2019-11-27 NOTE — Telephone Encounter (Signed)
I called pt and notified her that her form has been completed YL,RMA

## 2020-08-04 ENCOUNTER — Encounter: Payer: BC Managed Care – PPO | Admitting: Internal Medicine

## 2020-11-09 ENCOUNTER — Ambulatory Visit (INDEPENDENT_AMBULATORY_CARE_PROVIDER_SITE_OTHER): Payer: 59 | Admitting: Nurse Practitioner

## 2020-11-09 ENCOUNTER — Encounter: Payer: Self-pay | Admitting: Nurse Practitioner

## 2020-11-09 ENCOUNTER — Other Ambulatory Visit: Payer: Self-pay

## 2020-11-09 VITALS — BP 120/68 | HR 70 | Temp 98.7°F | Ht 66.2 in | Wt 212.2 lb

## 2020-11-09 DIAGNOSIS — R002 Palpitations: Secondary | ICD-10-CM

## 2020-11-09 DIAGNOSIS — E6609 Other obesity due to excess calories: Secondary | ICD-10-CM

## 2020-11-09 DIAGNOSIS — R5383 Other fatigue: Secondary | ICD-10-CM | POA: Diagnosis not present

## 2020-11-09 DIAGNOSIS — Z1211 Encounter for screening for malignant neoplasm of colon: Secondary | ICD-10-CM

## 2020-11-09 DIAGNOSIS — E66811 Obesity, class 1: Secondary | ICD-10-CM

## 2020-11-09 DIAGNOSIS — Z6834 Body mass index (BMI) 34.0-34.9, adult: Secondary | ICD-10-CM

## 2020-11-09 DIAGNOSIS — R7309 Other abnormal glucose: Secondary | ICD-10-CM

## 2020-11-09 DIAGNOSIS — Z1159 Encounter for screening for other viral diseases: Secondary | ICD-10-CM

## 2020-11-09 NOTE — Progress Notes (Signed)
I,Tianna Badgett,acting as a Neurosurgeon for [employer], FNP.,have documented all relevant documentation on the behalf of Arnette Felts, FNP,as directed by  Arnette Felts, FNP while in the presence of Arnette Felts, FNP.  This visit occurred during the SARS-CoV-2 public health emergency.  Safety protocols were in place, including screening questions prior to the visit, additional usage of staff PPE, and extensive cleaning of exam room while observing appropriate contact time as indicated for disinfecting solutions.  Subjective:     Patient ID: Charlene Evans , female    DOB: 09-01-1970 , 50 y.o.   MRN: [id number]   Chief Complaint  Patient presents with   Fatigue    HPI  Patient states that she has been experiencing fatigue for the past few months. She would like her tsh checked. She states that she has had two cycles this month, she is also bleeding heavy.  She has been having heart fluttering over the last couple of months. She admits to not drinking any water. She is also having joint pain. She is working one job and has recently graduated from [employer] in [employer].   She also feels like her food is not moving down when she eats. She admits to getting choked in her sleep a lot.    Wt Readings from Last 3 Encounters: 11/09/20 : 212 lb 3.2 oz (96.3 kg) 08/01/19 : 213 lb (96.6 kg) 08/01/18 : 206 lb 12.8 oz (93.8 kg)      Past Medical History:  Diagnosis Date   Anemia      Family History  Problem Relation Age of Onset   Diabetes Mother    Hypertension Mother    Cancer Father    Cancer Maternal Grandmother    Diabetes Maternal Grandmother    Cancer Maternal Grandfather    Diabetes Maternal Grandfather    Hypertension Maternal Grandfather     No current outpatient medications on file.   No Known Allergies   Review of Systems  Constitutional:  Positive for fatigue.  Respiratory: Negative.    Cardiovascular: Negative.  Negative for chest  pain, palpitations and leg swelling.  Gastrointestinal: Negative.   Neurological:  Positive for dizziness. Negative for weakness and headaches.  Psychiatric/Behavioral: Negative.      Today's Vitals   11/09/20 1026  BP: 120/68  Pulse: 70  Temp: 98.7 F (37.1 C)  TempSrc: Oral  Weight: 212 lb 3.2 oz (96.3 kg)  Height: 5' 6.2" (1.681 m)   Body mass index is 34.04 kg/m.  Wt Readings from Last 3 Encounters:  11/09/20 212 lb 3.2 oz (96.3 kg)  08/01/19 213 lb (96.6 kg)  08/01/18 206 lb 12.8 oz (93.8 kg)    Objective:  Physical Exam Vitals reviewed.  Constitutional:      General: She is not in acute distress.    Appearance: Normal appearance. She is obese.  Cardiovascular:     Rate and Rhythm: Normal rate and regular rhythm.     Pulses: Normal pulses.     Heart sounds: Normal heart sounds. No murmur heard. Pulmonary:     Effort: Pulmonary effort is normal. No respiratory distress.     Breath sounds: Normal breath sounds. No wheezing.  Skin:    General: Skin is warm and dry.     Capillary Refill: Capillary refill takes less than 2 seconds.  Neurological:     General: No focal deficit present.     Mental Status: She is alert and oriented to person, place,  and time.     Motor: No weakness.  Psychiatric:        Mood and Affect: Mood normal.        Behavior: Behavior normal.        Thought Content: Thought content normal.        Judgment: Judgment normal.        Assessment And Plan:     1. Fatigue, unspecified type Will check metabolic causes I will also refer for sleep study due to persistent fatigue - TSH - CBC with Differential/Platelet - Iron, TIBC and Ferritin Panel - Vitamin B12 - Ambulatory referral to Sleep Studies  2. Abnormal glucose Chronic, no current medications Will check HgbA1c - Hemoglobin A1c  3. Palpitation EKG done with SR with Nonspecific T-abnormality HR 62 Encouraged to stay well hydrated and avoid caffeine - EKG 12-Lead  4. Class 1  obesity due to excess calories without serious comorbidity with body mass index (BMI) of 34.0 to 34.9 in adult Chronic Discussed healthy diet and regular exercise options  Encouraged to exercise at least 150 minutes per week with 2 days of strength training - Ambulatory referral to Sleep Studies  5. Encounter for screening colonoscopy According to USPTF Colorectal cancer Screening guidelines. Colonoscopy is recommended every 10 years, starting at age 54 years. Will refer to GI for colon cancer screening. - Ambulatory referral to Gastroenterology  6. Encounter for hepatitis C screening test for low risk patient Will check Hepatitis C screening due to recent recommendations to screen all adults 18 years and older - Hepatitis C antibody  She is encouraged to strive for BMI less than 30 to decrease cardiac risk. Advised to aim for at least 150 minutes of exercise per week.    Patient was given opportunity to ask questions. Patient verbalized understanding of the plan and was able to repeat key elements of the plan. All questions were answered to their satisfaction.  Arnette Felts, FNP   I, Arnette Felts, FNP, have reviewed all documentation for this visit. The documentation on 11/09/20 for the exam, diagnosis, procedures, and orders are all accurate and complete.   IF YOU HAVE BEEN REFERRED TO A SPECIALIST, IT MAY TAKE 1-2 WEEKS TO SCHEDULE/PROCESS THE REFERRAL. IF YOU HAVE NOT HEARD FROM US/SPECIALIST IN TWO WEEKS, PLEASE GIVE [place] A CALL AT [phone] X [phone].   THE PATIENT IS ENCOURAGED TO PRACTICE SOCIAL DISTANCING DUE TO THE COVID-19 PANDEMIC.

## 2020-11-10 LAB — CBC WITH DIFFERENTIAL/PLATELET
Basophils Absolute: 0 10*3/uL (ref 0.0–0.2)
Basos: 1 %
EOS (ABSOLUTE): 0.1 10*3/uL (ref 0.0–0.4)
Eos: 1 %
Hematocrit: 37.3 % (ref 34.0–46.6)
Hemoglobin: 11.4 g/dL (ref 11.1–15.9)
Immature Grans (Abs): 0 10*3/uL (ref 0.0–0.1)
Immature Granulocytes: 0 %
Lymphocytes Absolute: 1.8 10*3/uL (ref 0.7–3.1)
Lymphs: 26 %
MCH: 19.6 pg — ABNORMAL LOW (ref 26.6–33.0)
MCHC: 30.6 g/dL — ABNORMAL LOW (ref 31.5–35.7)
MCV: 64 fL — ABNORMAL LOW (ref 79–97)
Monocytes Absolute: 0.4 10*3/uL (ref 0.1–0.9)
Monocytes: 6 %
Neutrophils Absolute: 4.4 10*3/uL (ref 1.4–7.0)
Neutrophils: 66 %
Platelets: 374 10*3/uL (ref 150–450)
RBC: 5.83 x10E6/uL — ABNORMAL HIGH (ref 3.77–5.28)
RDW: 19.6 % — ABNORMAL HIGH (ref 11.7–15.4)
WBC: 6.7 10*3/uL (ref 3.4–10.8)

## 2020-11-10 LAB — IRON,TIBC AND FERRITIN PANEL
Ferritin: 31 ng/mL (ref 15–150)
Iron Saturation: 11 % — ABNORMAL LOW (ref 15–55)
Iron: 35 ug/dL (ref 27–159)
Total Iron Binding Capacity: 324 ug/dL (ref 250–450)
UIBC: 289 ug/dL (ref 131–425)

## 2020-11-10 LAB — HEPATITIS C ANTIBODY: Hep C Virus Ab: 0.2 s/co ratio (ref 0.0–0.9)

## 2020-11-10 LAB — HEMOGLOBIN A1C
Est. average glucose Bld gHb Est-mCnc: 120 mg/dL
Hgb A1c MFr Bld: 5.8 % — ABNORMAL HIGH (ref 4.8–5.6)

## 2020-11-10 LAB — VITAMIN B12: Vitamin B-12: 901 pg/mL (ref 232–1245)

## 2020-11-10 LAB — TSH: TSH: 2.21 u[IU]/mL (ref 0.450–4.500)

## 2020-11-20 ENCOUNTER — Encounter: Payer: Self-pay | Admitting: Gastroenterology

## 2020-12-09 ENCOUNTER — Encounter: Payer: 59 | Admitting: Nurse Practitioner

## 2021-01-07 ENCOUNTER — Encounter: Payer: 59 | Admitting: Nurse Practitioner

## 2021-02-05 ENCOUNTER — Ambulatory Visit (AMBULATORY_SURGERY_CENTER): Payer: 59

## 2021-02-05 ENCOUNTER — Other Ambulatory Visit: Payer: Self-pay

## 2021-02-05 VITALS — Ht 67.0 in | Wt 213.0 lb

## 2021-02-05 DIAGNOSIS — Z1211 Encounter for screening for malignant neoplasm of colon: Secondary | ICD-10-CM

## 2021-02-05 MED ORDER — SUTAB 1479-225-188 MG PO TABS: 1.0000 | ORAL_TABLET | ORAL | 0 refills | Status: DC

## 2021-02-05 NOTE — Progress Notes (Signed)
Pre visit completed via phone call; Patient verified name, DOB, and address; No egg or soy allergy known to patient  No issues known to pt with past sedation with any surgeries or procedures Patient denies ever being told they had issues or difficulty with intubation  No FH of Malignant Hyperthermia Pt is not on diet pills Pt is not on home 02  Pt is not on blood thinners  Pt denies issues with constipation  No A fib or A flutter Pt is fully vaccinated for Covid x 2; Coupon given to pt in [facility] today , Code to Pharmacy and  NO PA's for preps discussed with pt in [facility] today  Discussed with pt there will be an out-of-pocket cost for prep and that varies from $0 to 70 +  dollars - pt verbalized understanding  Due to the COVID-19 pandemic we are asking patients to follow certain guidelines in [facility] and the [facility]   Pt aware of COVID protocols and [facility] guidelines   

## 2021-02-19 ENCOUNTER — Encounter: Payer: Self-pay | Admitting: Gastroenterology

## 2021-04-01 ENCOUNTER — Ambulatory Visit (INDEPENDENT_AMBULATORY_CARE_PROVIDER_SITE_OTHER): Payer: Self-pay | Admitting: Nurse Practitioner

## 2021-04-01 ENCOUNTER — Encounter: Payer: Self-pay | Admitting: Nurse Practitioner

## 2021-04-01 ENCOUNTER — Other Ambulatory Visit: Payer: Self-pay

## 2021-04-01 VITALS — BP 128/72 | HR 84 | Temp 98.7°F | Ht 66.4 in | Wt 215.6 lb

## 2021-04-01 DIAGNOSIS — Z0000 Encounter for general adult medical examination without abnormal findings: Secondary | ICD-10-CM

## 2021-04-01 DIAGNOSIS — Z13228 Encounter for screening for other metabolic disorders: Secondary | ICD-10-CM

## 2021-04-01 DIAGNOSIS — R7309 Other abnormal glucose: Secondary | ICD-10-CM

## 2021-04-01 DIAGNOSIS — E6609 Other obesity due to excess calories: Secondary | ICD-10-CM

## 2021-04-01 DIAGNOSIS — Z6834 Body mass index (BMI) 34.0-34.9, adult: Secondary | ICD-10-CM

## 2021-04-01 DIAGNOSIS — R5383 Other fatigue: Secondary | ICD-10-CM

## 2021-04-01 DIAGNOSIS — E559 Vitamin D deficiency, unspecified: Secondary | ICD-10-CM

## 2021-04-01 DIAGNOSIS — J3489 Other specified disorders of nose and nasal sinuses: Secondary | ICD-10-CM

## 2021-04-01 DIAGNOSIS — B351 Tinea unguium: Secondary | ICD-10-CM

## 2021-04-01 NOTE — Progress Notes (Signed)
I,Tianna Badgett,acting as a Education administrator for [employer], NP.,have documented all relevant documentation on the behalf of [employer], NP,as directed by  Bary Castilla, NP while in the presence of Bary Castilla, NP.  This visit occurred during the SARS-CoV-2 public health emergency.  Safety protocols were in place, including screening questions prior to the visit, additional usage of staff PPE, and extensive cleaning of exam room while observing appropriate contact time as indicated for disinfecting solutions.  Subjective:     Patient ID: Charlene Evans , female    DOB: [birth year] , 50 y.o.   MRN: [id number]   Chief Complaint  Patient presents with   Annual Exam   HPI  Here for HM.  She is doing good.  Diet: She does not eat well Exercise: She does not exercise. She works as Therapist, sports  She does not drink or smoke  She does have some concern about sinus pressure.  She is getting a colonoscopy on Tuesday with [facility].  She is not sexually active. Not on any birth control. Period every month.      Past Medical History:  Diagnosis Date   Anemia    Anxiety    hx of   Thalassemia      Family History  Problem Relation Age of Onset   Diabetes Mother    Hypertension Mother    Cancer Father    Pancreatic cancer Maternal Grandmother    Colon polyps Maternal Grandmother    Diabetes Maternal Grandmother    Cancer Maternal Grandfather    Diabetes Maternal Grandfather    Hypertension Maternal Grandfather    Colon cancer Neg Hx    Esophageal cancer Neg Hx    Stomach cancer Neg Hx    Rectal cancer Neg Hx     No current outpatient medications on file.   No Known Allergies    The patient states she uses none for birth control. Last LMP was Patient's last menstrual period was 03/20/2021.. Negative for Dysmenorrhea. Negative for: breast discharge, breast lump(s), breast pain and breast self exam. Associated symptoms include abnormal vaginal bleeding. Pertinent negatives  include abnormal bleeding (hematology), anxiety, decreased libido, depression, difficulty falling sleep, dyspareunia, history of infertility, nocturia, sexual dysfunction, sleep disturbances, urinary incontinence, urinary urgency, vaginal discharge and vaginal itching. Diet regular.The patient states her exercise level is    . The patient's tobacco use is: none  Social History   Tobacco Use  Smoking Status Never  Smokeless Tobacco Never  . She has been exposed to passive smoke. The patient's alcohol use is:  Social History   Substance and Sexual Activity  Alcohol Use No   Alcohol/week: 0.0 standard drinks  . Additional information: Last pap 2021, next one scheduled for 2023.    Review of Systems  Constitutional: Negative.  Negative for chills and fever.  HENT:  Positive for sinus pressure. Negative for congestion and rhinorrhea.   Eyes: Negative.   Respiratory: Negative.  Negative for cough, shortness of breath and wheezing.   Cardiovascular: Negative.  Negative for chest pain.  Gastrointestinal: Negative.  Negative for constipation and diarrhea.  Endocrine: Negative.  Negative for polydipsia, polyphagia and polyuria.  Genitourinary: Negative.  Negative for vaginal pain.  Musculoskeletal: Negative.  Negative for arthralgias and myalgias.  Skin: Negative.   Allergic/Immunologic: Negative.   Neurological: Negative.  Negative for dizziness, weakness and headaches.  Hematological: Negative.   Psychiatric/Behavioral: Negative.      Today's Vitals   04/01/21 1058  BP: 128/72  Pulse: 84  Temp: 98.7 F (37.1 C)  TempSrc: Oral  Weight: 215 lb 9.6 oz (97.8 kg)  Height: 5' 6.4" (1.687 m)   Body mass index is 34.38 kg/m.  Wt Readings from Last 3 Encounters:  04/01/21 215 lb 9.6 oz (97.8 kg)  02/05/21 213 lb (96.6 kg)  11/09/20 212 lb 3.2 oz (96.3 kg)    Objective:  Physical Exam Vitals and nursing note reviewed.  Constitutional:      General: She is not in acute distress.     Appearance: Normal appearance. She is well-developed. She is obese.  HENT:     Head: Normocephalic and atraumatic.     Right Ear: Hearing, tympanic membrane, ear canal and external ear normal. There is no impacted cerumen.     Left Ear: Hearing, tympanic membrane, ear canal and external ear normal. There is no impacted cerumen.     Nose:     Comments: Deferred - masked    Mouth/Throat:     Comments: Deferred - masked Eyes:     General: Lids are normal.     Extraocular Movements: Extraocular movements intact.     Conjunctiva/sclera: Conjunctivae normal.     Pupils: Pupils are equal, round, and reactive to light.     Funduscopic exam:    Right eye: No papilledema.        Left eye: No papilledema.  Neck:     Thyroid: No thyroid mass.     Vascular: No carotid bruit.  Cardiovascular:     Rate and Rhythm: Normal rate and regular rhythm.     Pulses: Normal pulses.     Heart sounds: Normal heart sounds. No murmur heard. Pulmonary:     Effort: Pulmonary effort is normal. No respiratory distress.     Breath sounds: Normal breath sounds. No wheezing.  Chest:     Chest wall: No mass.  Breasts:    Tanner Score is 5.     Right: Normal. No mass or tenderness.     Left: Normal. No mass or tenderness.  Abdominal:     General: Abdomen is flat. Bowel sounds are normal. There is no distension.     Palpations: Abdomen is soft.     Tenderness: There is no abdominal tenderness.  Genitourinary:    Rectum: Guaiac result negative.     Comments: deferred Musculoskeletal:        General: No swelling. Normal range of motion.     Cervical back: Full passive range of motion without pain, normal range of motion and neck supple.     Right lower leg: No edema.     Left lower leg: No edema.  Lymphadenopathy:     Upper Body:     Right upper body: No supraclavicular, axillary or pectoral adenopathy.     Left upper body: No supraclavicular, axillary or pectoral adenopathy.  Skin:    General: Skin is warm  and dry.     Capillary Refill: Capillary refill takes less than 2 seconds.     Comments: Fungal on right big toe   Neurological:     General: No focal deficit present.     Mental Status: She is alert and oriented to person, place, and time.     Cranial Nerves: No cranial nerve deficit.     Sensory: No sensory deficit.  Psychiatric:        Mood and Affect: Mood normal.        Behavior: Behavior normal.        Thought  Content: Thought content normal.        Judgment: Judgment normal.        Assessment And Plan:     1. Encounter for annual physical exam --Patient is here for their annual physical exam and we discussed any changes to medication and medical history.  -Behavior modification was discussed as well as diet and exercise history  -Patient will continue to exercise regularly and modify their diet.  -Recommendation for yearly physical annuals, immunization and screenings including mammogram and colonoscopy were discussed with the patient.  -Recommended intake of multivitamin, vitamin D and calcium.  -Individualized advise was given to the patient pertaining to their own health history in regards to diet, exercise, medical condition and referrals.  - MM Digital Screening; Future  2. Abnormal glucose -will check her hgb A1c  - Hemoglobin A1c  3. Encounter for screening for metabolic disorder -Will check her for metabolic disorders  - CBC - CMP14+EGFR - Lipid panel  4. Sinus pressure -She has been experiencing sinus pressure/pain for awhile and would like to see a specialist.  - Ambulatory referral to ENT  5. Onychomycosis -Right toe nail fungus. Will check her liver functions and prescribe her antifungal med after viewing results. Pt will return in 6 weeks for another lab  - CMP14+EGFR  6. Vitamin D deficiency -Will check and supplement if needed. Advised patient to spend atleast 15 min. Daily in sunlight.  - Vitamin D (25 hydroxy)  7. Other fatigue -Will check  thyroid level and assess.  - TSH + free T4  8. Class 1 obesity due to excess calories without serious comorbidity with body mass index (BMI) of 34.0 to 34.9 in adult -Advised patient on a healthy diet including avoiding fast food and red meats. Increase the intake of lean meats including grilled chicken and [place].  Drink a lot of water. Decrease intake of fatty foods. Exercise for 30-45 min. 4-5 a week to decrease the risk of cardiac event.   The patient was encouraged to call or send a message through [facility] for any questions or concerns.   Follow up: if symptoms persist or do not get better.   Side effects and appropriate use of all the medication(s) were discussed with the patient today. Patient advised to use the medication(s) as directed by their healthcare provider. The patient was encouraged to read, review, and understand all associated package inserts and contact our office with any questions or concerns. The patient accepts the risks of the treatment plan and had an opportunity to ask questions.   Staying healthy and adopting a healthy lifestyle for your overall health is important. You should eat 7 or more servings of fruits and vegetables per day. You should drink plenty of water to keep yourself hydrated and your kidneys healthy. This includes about 65-80+ fluid ounces of water. Limit your intake of animal fats especially for elevated cholesterol. Avoid highly processed food and limit your salt intake if you have hypertension. Avoid foods high in saturated/Trans fats. Along with a healthy diet it is also very important to maintain time for yourself to maintain a healthy mental health with low stress levels. You should get atleast 150 min of moderate intensity exercise weekly for a healthy heart. Along with eating right and exercising, aim for at least 7-9 hours of sleep daily.  Eat more whole grains which includes barley, wheat berries, oats, brown rice and whole wheat pasta. Use healthy  plant oils which include olive, soy, corn, sunflower and  peanut. Limit your caffeine and sugary drinks. Limit your intake of fast foods. Limit milk and dairy products to one or two daily servings.   Patient was given opportunity to ask questions. Patient verbalized understanding of the plan and was able to repeat key elements of the plan. All questions were answered to their satisfaction.  Raman Daney Moor, DNP   I, Raman Aubert Choyce have reviewed all documentation for this visit. The documentation on 04/01/21 for the exam, diagnosis, procedures, and orders are all accurate and complete.    THE PATIENT IS ENCOURAGED TO PRACTICE SOCIAL DISTANCING DUE TO THE COVID-19 PANDEMIC.

## 2021-04-01 NOTE — Patient Instructions (Signed)

## 2021-04-02 LAB — CBC
Hematocrit: 38.9 % (ref 34.0–46.6)
Hemoglobin: 11.9 g/dL (ref 11.1–15.9)
MCH: 19.4 pg — ABNORMAL LOW (ref 26.6–33.0)
MCHC: 30.6 g/dL — ABNORMAL LOW (ref 31.5–35.7)
MCV: 63 fL — ABNORMAL LOW (ref 79–97)
Platelets: 391 10*3/uL (ref 150–450)
RBC: 6.14 x10E6/uL — ABNORMAL HIGH (ref 3.77–5.28)
RDW: 19.8 % — ABNORMAL HIGH (ref 11.7–15.4)
WBC: 7.7 10*3/uL (ref 3.4–10.8)

## 2021-04-02 LAB — TSH+FREE T4
Free T4: 1.24 ng/dL (ref 0.82–1.77)
TSH: 1.9 u[IU]/mL (ref 0.450–4.500)

## 2021-04-02 LAB — HEMOGLOBIN A1C
Est. average glucose Bld gHb Est-mCnc: 120 mg/dL
Hgb A1c MFr Bld: 5.8 % — ABNORMAL HIGH (ref 4.8–5.6)

## 2021-04-02 LAB — CMP14+EGFR
ALT: 10 IU/L (ref 0–32)
AST: 16 IU/L (ref 0–40)
Albumin/Globulin Ratio: 1.8 (ref 1.2–2.2)
Albumin: 4.9 g/dL (ref 3.8–4.9)
Alkaline Phosphatase: 85 IU/L (ref 44–121)
BUN/Creatinine Ratio: 10 (ref 9–23)
BUN: 9 mg/dL (ref 6–24)
Bilirubin Total: 0.4 mg/dL (ref 0.0–1.2)
CO2: 21 mmol/L (ref 20–29)
Calcium: 9.3 mg/dL (ref 8.7–10.2)
Chloride: 102 mmol/L (ref 96–106)
Creatinine, Ser: 0.92 mg/dL (ref 0.57–1.00)
Globulin, Total: 2.7 g/dL (ref 1.5–4.5)
Glucose: 89 mg/dL (ref 70–99)
Potassium: 4.1 mmol/L (ref 3.5–5.2)
Sodium: 138 mmol/L (ref 134–144)
Total Protein: 7.6 g/dL (ref 6.0–8.5)
eGFR: 75 mL/min/{1.73_m2} (ref >59)

## 2021-04-02 LAB — LIPID PANEL
Chol/HDL Ratio: 2.7 ratio (ref 0.0–4.4)
Cholesterol, Total: 177 mg/dL (ref 100–199)
HDL: 66 mg/dL (ref >39)
LDL Chol Calc (NIH): 91 mg/dL (ref 0–99)
Triglycerides: 110 mg/dL (ref 0–149)
VLDL Cholesterol Cal: 20 mg/dL (ref 5–40)

## 2021-04-02 LAB — VITAMIN D 25 HYDROXY (VIT D DEFICIENCY, FRACTURES): Vit D, 25-Hydroxy: 15.8 ng/mL — ABNORMAL LOW (ref 30.0–100.0)

## 2021-04-05 ENCOUNTER — Other Ambulatory Visit: Payer: Self-pay | Admitting: Nurse Practitioner

## 2021-04-05 DIAGNOSIS — E559 Vitamin D deficiency, unspecified: Secondary | ICD-10-CM

## 2021-04-05 MED ORDER — VITAMIN D (ERGOCALCIFEROL) 1.25 MG (50000 UNIT) PO CAPS
50000.0000 [IU] | ORAL_CAPSULE | ORAL | 0 refills | Status: DC
Start: 2021-04-05 — End: 2022-07-13

## 2021-04-06 ENCOUNTER — Other Ambulatory Visit: Payer: Self-pay

## 2021-04-06 ENCOUNTER — Ambulatory Visit (AMBULATORY_SURGERY_CENTER): Payer: Self-pay | Admitting: Gastroenterology

## 2021-04-06 ENCOUNTER — Encounter: Payer: Self-pay | Admitting: Gastroenterology

## 2021-04-06 VITALS — BP 139/79 | HR 64 | Temp 98.0°F | Resp 17 | Ht 67.0 in | Wt 213.0 lb

## 2021-04-06 DIAGNOSIS — Z1211 Encounter for screening for malignant neoplasm of colon: Secondary | ICD-10-CM

## 2021-04-06 MED ORDER — SODIUM CHLORIDE 0.9 % IV SOLN: 500.0000 mL | Freq: Once | INTRAVENOUS | Status: DC

## 2021-04-06 NOTE — Progress Notes (Signed)
History and Physical:  This patient presents for endoscopic testing for: Encounter Diagnosis  Name Primary?   Special screening for malignant neoplasms, colon Yes    Patient denies chronic abdominal pain, rectal bleeding, constipation or diarrhea. First screening - average risk  ROS: Patient denies chest pain or cough   Past Medical History: Past Medical History:  Diagnosis Date   Anemia    Anxiety    hx of   Thalassemia      Past Surgical History: Past Surgical History:  Procedure Laterality Date   NO PAST SURGERIES      Allergies: No Known Allergies  Outpatient Meds: Current Outpatient Medications  Medication Sig Dispense Refill   Vitamin D, Ergocalciferol, (DRISDOL) 1.25 MG (50000 UNIT) CAPS capsule Take 1 capsule (50,000 Units total) by mouth every 7 (seven) days. (Patient not taking: Reported on 04/06/2021) 12 capsule 0   Current Facility-Administered Medications  Medication Dose Route Frequency Provider Last Rate Last Admin   0.9 %  sodium chloride infusion  500 mL Intravenous Once Sherrilyn Rist, MD          ___________________________________________________________________ Objective   Exam:  BP (!) 146/85    Pulse 70    Temp 98 F (36.7 C)    Resp 19    Ht 5\' 7"  (1.702 m)    Wt 213 lb (96.6 kg)    LMP 03/20/2021    SpO2 99%    BMI 33.36 kg/m   CV: RRR without murmur, S1/S2 Resp: clear to auscultation bilaterally, normal RR and effort noted GI: soft, no tenderness, with active bowel sounds.   Assessment: Encounter Diagnosis  Name Primary?   Special screening for malignant neoplasms, colon Yes     Plan: Colonoscopy  The benefits and risks of the planned procedure were described in detail with the patient or (when appropriate) their health care proxy.  Risks were outlined as including, but not limited to, bleeding, infection, perforation, adverse medication reaction leading to cardiac or pulmonary decompensation, pancreatitis (if ERCP).   The limitation of incomplete mucosal visualization was also discussed.  No guarantees or warranties were given.    The patient is appropriate for an endoscopic procedure in the ambulatory setting.   - 03/22/2021, MD

## 2021-04-06 NOTE — Progress Notes (Signed)
Pt's states no medical or surgical changes since previsit or office visit.   CHECK-IN-AER  V/S-[place] 

## 2021-04-06 NOTE — Progress Notes (Signed)
PT taken to PACU. Monitors in place. VSS. Report given to RN. 

## 2021-04-06 NOTE — Op Note (Signed)
[facility] Patient Name: Charlene Evans Procedure Date: 04/06/2021 9:19 AM MRN: [id number] Endoscopist: Sherilyn Cooter L. Myrtie Neither , MD Age: 50 Referring MD:  Date of Birth: [birth year] Gender: Female Account #: [id number] Procedure:                Colonoscopy Indications:              Screening for colorectal malignant neoplasm, This                            is the patient's first colonoscopy Medicines:                Monitored Anesthesia Care Procedure:                Pre-Anesthesia Assessment:                           - Prior to the procedure, a History and Physical                            was performed, and patient medications and                            allergies were reviewed. The patient's tolerance of                            previous anesthesia was also reviewed. The risks                            and benefits of the procedure and the sedation                            options and risks were discussed with the patient.                            All questions were answered, and informed consent                            was obtained. Prior Anticoagulants: The patient has                            taken no previous anticoagulant or antiplatelet                            agents. ASA Grade Assessment: II - A patient with                            mild systemic disease. After reviewing the risks                            and benefits, the patient was deemed in                            satisfactory condition to undergo the procedure.  After obtaining informed consent, the colonoscope                            was passed under direct vision. Throughout the                            procedure, the patient's blood pressure, pulse, and                            oxygen saturations were monitored continuously. The                            CF [id number] #[id number] was introduced through the anus                            and advanced to the the  cecum, identified by                            appendiceal orifice and ileocecal valve. The                            colonoscopy was performed without difficulty. The                            patient tolerated the procedure well. The quality                            of the bowel preparation was excellent. The                            ileocecal valve, appendiceal orifice, and rectum                            were photographed. The bowel preparation used was                            SUPREP. Scope In: 9:37:25 AM Scope Out: 9:49:27 AM Scope Withdrawal Time: 0 hours 8 minutes 58 seconds  Total Procedure Duration: 0 hours 12 minutes 2 seconds  Findings:                 The perianal and digital rectal examinations were                            normal.                           The entire examined colon appeared normal on direct                            and retroflexion views. Complications:            No immediate complications. Estimated Blood Loss:     Estimated blood loss: none. Impression:               - The entire examined colon is normal on  direct and                            retroflexion views.                           - No specimens collected. Recommendation:           - Patient has a contact number available for                            emergencies. The signs and symptoms of potential                            delayed complications were discussed with the                            patient. Return to normal activities tomorrow.                            Written discharge instructions were provided to the                            patient.                           - Resume previous diet.                           - Continue present medications.                           - Repeat colonoscopy in 10 years for screening                            purposes. Macalister Arnaud L. Myrtie Neither, MD 04/06/2021 9:52:40 AM This report has been signed electronically.

## 2021-04-06 NOTE — Patient Instructions (Addendum)
You may resume your current medications today. Repeat colonoscopy for screening purposes in 10 years. Please call if any questions or concerns.      YOU HAD AN ENDOSCOPIC PROCEDURE TODAY AT THE [facility]:   Refer to the procedure report that was given to you for any specific questions about what was found during the examination.  If the procedure report does not answer your questions, please call your gastroenterologist to clarify.  If you requested that your care partner not be given the details of your procedure findings, then the procedure report has been included in a sealed envelope for you to review at your convenience later.  YOU SHOULD EXPECT: Some feelings of bloating in the abdomen. Passage of more gas than usual.  Walking can help get rid of the air that was put into your GI tract during the procedure and reduce the bloating. If you had a lower endoscopy (such as a colonoscopy or flexible sigmoidoscopy) you may notice spotting of blood in your stool or on the toilet paper. If you underwent a bowel prep for your procedure, you may not have a normal bowel movement for a few days.  Please Note:  You might notice some irritation and congestion in your nose or some drainage.  This is from the oxygen used during your procedure.  There is no need for concern and it should clear up in a day or so.  SYMPTOMS TO REPORT IMMEDIATELY:  Following lower endoscopy (colonoscopy or flexible sigmoidoscopy):  Excessive amounts of blood in the stool  Significant tenderness or worsening of abdominal pains  Swelling of the abdomen that is new, acute  Fever of 100F or higher   For urgent or emergent issues, a gastroenterologist can be reached at any hour by calling [phone]. Do not use MyChart messaging for urgent concerns.    DIET:  We do recommend a small meal at first, but then you may proceed to your regular diet.  Drink plenty of fluids but you should avoid alcoholic  beverages for 24 hours.  ACTIVITY:  You should plan to take it easy for the rest of today and you should NOT DRIVE or use heavy machinery until tomorrow (because of the sedation medicines used during the test).    FOLLOW UP: Our staff will call the number listed on your records 48-72 hours following your procedure to check on you and address any questions or concerns that you may have regarding the information given to you following your procedure. If we do not reach you, we will leave a message.  We will attempt to reach you two times.  During this call, we will ask if you have developed any symptoms of COVID 19. If you develop any symptoms (ie: fever, flu-like symptoms, shortness of breath, cough etc.) before then, please call [phone].  If you test positive for Covid 19 in the 2 weeks post procedure, please call and report this information to [place].    If any biopsies were taken you will be contacted by phone or by letter within the next 1-3 weeks.  Please call us at [phone] if you have not heard about the biopsies in 3 weeks.    SIGNATURES/CONFIDENTIALITY: You and/or your care partner have signed paperwork which will be entered into your electronic medical record.  These signatures attest to the fact that that the information above on your After Visit Summary has been reviewed and is understood.  Full responsibility of the confidentiality of this  discharge information lies with you and/or your care-partner.  

## 2021-04-06 NOTE — Progress Notes (Signed)
No problems noted in the recovery room. maw 

## 2021-04-08 ENCOUNTER — Telehealth: Payer: Self-pay

## 2021-04-08 NOTE — Telephone Encounter (Signed)
°  Follow up Call-  Call back number 04/06/2021  Post procedure Call Back phone  # [phone]  Permission to leave phone message Yes  Some recent data might be hidden     Patient questions:  Do you have a fever, pain , or abdominal swelling? No. Pain Score  0 *  Have you tolerated food without any problems? Yes.    Have you been able to return to your normal activities? Yes.    Do you have any questions about your discharge instructions: Diet   No. Medications  No. Follow up visit  No.  Do you have questions or concerns about your Care? No.  Actions: * If pain score is 4 or above: No action needed, pain <4.

## 2021-04-13 ENCOUNTER — Encounter: Payer: Self-pay | Admitting: Nurse Practitioner

## 2021-04-14 ENCOUNTER — Encounter: Payer: Self-pay | Admitting: Nurse Practitioner

## 2021-04-14 ENCOUNTER — Telehealth (INDEPENDENT_AMBULATORY_CARE_PROVIDER_SITE_OTHER): Payer: Self-pay | Admitting: Nurse Practitioner

## 2021-04-14 DIAGNOSIS — R0981 Nasal congestion: Secondary | ICD-10-CM

## 2021-04-14 DIAGNOSIS — U071 COVID-19: Secondary | ICD-10-CM

## 2021-04-14 MED ORDER — MOMETASONE FUROATE 50 MCG/ACT NA SUSP: 2.0000 | Freq: Every day | NASAL | 2 refills | Status: DC

## 2021-04-14 MED ORDER — NOREL AD 4-10-325 MG PO TABS: 1.0000 | ORAL_TABLET | Freq: Three times a day (TID) | ORAL | 1 refills | Status: DC | PRN

## 2021-04-14 NOTE — Progress Notes (Signed)
Virtual Visit via MyChart   This visit type was conducted due to national recommendations for restrictions regarding the COVID-19 Pandemic (e.g. social distancing) in an effort to limit this patient's exposure and mitigate transmission in our community.  Due to her co-morbid illnesses, this patient is at least at moderate risk for complications without adequate follow up.  This format is felt to be most appropriate for this patient at this time.  All issues noted in this document were discussed and addressed.  A limited physical exam was performed with this format.    This visit type was conducted due to national recommendations for restrictions regarding the COVID-19 Pandemic (e.g. social distancing) in an effort to limit this patient's exposure and mitigate transmission in our community.  Patients identity confirmed using two different identifiers.  This format is felt to be most appropriate for this patient at this time.  All issues noted in this document were discussed and addressed.  No physical exam was performed (except for noted visual exam findings with Video Visits).    Date:  04/14/2021   ID:  Charlene Evans, DOB [birth year], MRN [id number]  Patient Location:  Home - spoke with Rosalva Ferron  Provider location:   Office    Chief Complaint:  positive for covid with home test; nasal congestion  History of Present Illness:    Charlene Evans is a 51 y.o. female who presents via video conferencing for a telehealth visit today.    The patient does have symptoms concerning for COVID-19 infection (fever, chills, cough, or new shortness of breath).   Sunday began having symptoms of nasal congestion. She had a fever with body aches. Reports having a headache. Denies shortness of breath or chest pain. Coughing at night; small amount of phlegm.  Cough has kept her up last night.   She has taken tylenol sinus, nasal decongestant and tussin.      Past Medical History:  Diagnosis Date    Anemia    Anxiety    hx of   Thalassemia    Past Surgical History:  Procedure Laterality Date   NO PAST SURGERIES       Current Meds  Medication Sig   mometasone (NASONEX) 50 MCG/ACT nasal spray Place 2 sprays into the nose daily.   NOREL AD 4-10-325 MG TABS Take 1 tablet by mouth 3 (three) times daily as needed.     Allergies:   Patient has no known allergies.   Social History   Tobacco Use   Smoking status: Never   Smokeless tobacco: Never  Vaping Use   Vaping Use: Never used  Substance Use Topics   Alcohol use: No    Alcohol/week: 0.0 standard drinks   Drug use: No     Family Hx: The patient's family history includes Cancer in her father and maternal grandfather; Colon polyps in her maternal grandmother; Diabetes in her maternal grandfather, maternal grandmother, and mother; Hypertension in her maternal grandfather and mother; Pancreatic cancer in her maternal grandmother. There is no history of Colon cancer, Esophageal cancer, Stomach cancer, or Rectal cancer.  ROS:   Please see the history of present illness.    Review of Systems  Constitutional: Negative.  Negative for fever and malaise/fatigue.  Respiratory:  Positive for cough and sputum production. Negative for shortness of breath and wheezing.   Cardiovascular:  Negative for chest pain.  Musculoskeletal:  Positive for myalgias.  Neurological:  Negative for dizziness and tingling.  Psychiatric/Behavioral:  Negative for  depression. The patient is not nervous/anxious.    All other systems reviewed and are negative.   Labs/Other Tests and Data Reviewed:    Recent Labs: 04/01/2021: ALT 10; BUN 9; Creatinine, Ser 0.92; Hemoglobin 11.9; Platelets 391; Potassium 4.1; Sodium 138; TSH 1.900   Recent Lipid Panel Lab Results  Component Value Date/Time   CHOL 177 04/01/2021 11:27 AM   TRIG 110 04/01/2021 11:27 AM   HDL 66 04/01/2021 11:27 AM   CHOLHDL 2.7 04/01/2021 11:27 AM   LDLCALC 91 04/01/2021 11:27 AM     Wt Readings from Last 3 Encounters:  04/06/21 213 lb (96.6 kg)  04/01/21 215 lb 9.6 oz (97.8 kg)  02/05/21 213 lb (96.6 kg)     Exam:    Vital Signs:  LMP 03/20/2021     Physical Exam Vitals reviewed.  Constitutional:      General: She is not in acute distress.    Appearance: Normal appearance. She is obese.  Cardiovascular:     Rate and Rhythm: Normal rate and regular rhythm.     Pulses: Normal pulses.     Heart sounds: Normal heart sounds. No murmur heard. Skin:    Capillary Refill: Capillary refill takes less than 2 seconds.  Neurological:     General: No focal deficit present.     Mental Status: She is alert and oriented to person, place, and time.  Psychiatric:        Mood and Affect: Mood normal.        Behavior: Behavior normal.        Thought Content: Thought content normal.        Judgment: Judgment normal.    ASSESSMENT & PLAN:    1. Nasal congestion Be sure to drink adequate amounts of water to help with congestion and avoid dairy products - mometasone (NASONEX) 50 MCG/ACT nasal spray; Place 2 sprays into the nose daily.  Dispense: 1 each; Refill: 2 - NOREL AD 4-10-325 MG TABS; Take 1 tablet by mouth 3 (three) times daily as needed.  Dispense: 84 tablet; Refill: 1  2. COVID-19 She is on Day 4 of her symptoms and is improving, no fever. At this time does not need paxlovid. She will be rechecked at her job on Friday   COVID-19 Education: The signs and symptoms of COVID-19 were discussed with the patient and how to seek care for testing (follow up with PCP or arrange E-visit).  The importance of social distancing was discussed today.  Patient Risk:   After full review of this patients clinical status, I feel that they are at least moderate risk at this time.  Time:   Today, I have spent 8.5 minutes/ seconds with the patient with telehealth technology discussing above diagnoses.     Medication Adjustments/Labs and Tests Ordered: Current medicines are  reviewed at length with the patient today.  Concerns regarding medicines are outlined above.   Tests Ordered: No orders of the defined types were placed in this encounter.   Medication Changes: Meds ordered this encounter  Medications   mometasone (NASONEX) 50 MCG/ACT nasal spray    Sig: Place 2 sprays into the nose daily.    Dispense:  1 each    Refill:  2   NOREL AD 4-10-325 MG TABS    Sig: Take 1 tablet by mouth 3 (three) times daily as needed.    Dispense:  84 tablet    Refill:  1    Disposition:  Follow up prn  Signed,  Arnette Felts, FNP

## 2021-05-07 ENCOUNTER — Ambulatory Visit: Payer: 59

## 2021-05-11 ENCOUNTER — Other Ambulatory Visit: Payer: Self-pay

## 2021-05-11 ENCOUNTER — Ambulatory Visit
Admission: RE | Admit: 2021-05-11 | Discharge: 2021-05-11 | Disposition: A | Discharge status: Home / Self Care | Payer: 59 | Source: Ambulatory Visit | Attending: Nurse Practitioner | Admitting: Nurse Practitioner

## 2021-05-11 DIAGNOSIS — Z0000 Encounter for general adult medical examination without abnormal findings: Secondary | ICD-10-CM

## 2022-04-11 ENCOUNTER — Encounter: Payer: 59 | Admitting: Nurse Practitioner

## 2022-05-10 ENCOUNTER — Encounter: Payer: 59 | Admitting: Nurse Practitioner

## 2022-06-19 ENCOUNTER — Emergency Department (HOSPITAL_COMMUNITY)
Admission: EM | Admit: 2022-06-19 | Discharge: 2022-06-19 | Disposition: A | Discharge status: Home / Self Care | Payer: Commercial Managed Care - PPO | Attending: Emergency Medicine | Admitting: Emergency Medicine

## 2022-06-19 ENCOUNTER — Other Ambulatory Visit: Payer: Self-pay

## 2022-06-19 DIAGNOSIS — R519 Headache, unspecified: Secondary | ICD-10-CM | POA: Diagnosis present

## 2022-06-19 DIAGNOSIS — Z79899 Other long term (current) drug therapy: Secondary | ICD-10-CM | POA: Insufficient documentation

## 2022-06-19 DIAGNOSIS — D569 Thalassemia, unspecified: Secondary | ICD-10-CM | POA: Insufficient documentation

## 2022-06-19 DIAGNOSIS — I10 Essential (primary) hypertension: Secondary | ICD-10-CM | POA: Diagnosis not present

## 2022-06-19 LAB — CBC
HCT: 34.8 % — ABNORMAL LOW (ref 36.0–46.0)
Hemoglobin: 11.1 g/dL — ABNORMAL LOW (ref 12.0–15.0)
MCH: 19.3 pg — ABNORMAL LOW (ref 26.0–34.0)
MCHC: 31.9 g/dL (ref 30.0–36.0)
MCV: 60.5 fL — ABNORMAL LOW (ref 80.0–100.0)
Platelets: 370 10*3/uL (ref 150–400)
RBC: 5.75 MIL/uL — ABNORMAL HIGH (ref 3.87–5.11)
RDW: 16.8 % — ABNORMAL HIGH (ref 11.5–15.5)
WBC: 8.3 10*3/uL (ref 4.0–10.5)
nRBC: 0 % (ref 0.0–0.2)

## 2022-06-19 LAB — COMPREHENSIVE METABOLIC PANEL
ALT: 10 U/L (ref 0–44)
AST: 15 U/L (ref 15–41)
Albumin: 4.1 g/dL (ref 3.5–5.0)
Alkaline Phosphatase: 61 U/L (ref 38–126)
Anion gap: 10 (ref 5–15)
BUN: 11 mg/dL (ref 6–20)
CO2: 24 mmol/L (ref 22–32)
Calcium: 8.6 mg/dL — ABNORMAL LOW (ref 8.9–10.3)
Chloride: 105 mmol/L (ref 98–111)
Creatinine, Ser: 0.88 mg/dL (ref 0.44–1.00)
GFR, Estimated: >60 mL/min (ref >60)
Glucose, Bld: 113 mg/dL — ABNORMAL HIGH (ref 70–99)
Potassium: 3.3 mmol/L — ABNORMAL LOW (ref 3.5–5.1)
Sodium: 139 mmol/L (ref 135–145)
Total Bilirubin: 0.5 mg/dL (ref 0.3–1.2)
Total Protein: 7.6 g/dL (ref 6.5–8.1)

## 2022-06-19 MED ORDER — POTASSIUM CHLORIDE CRYS ER 20 MEQ PO TBCR
40.0000 meq | EXTENDED_RELEASE_TABLET | Freq: Once | ORAL | Status: AC
  Administered 2022-06-19: 40 meq via ORAL
  Filled 2022-06-19: qty 2

## 2022-06-19 MED ORDER — AMLODIPINE BESYLATE 5 MG PO TABS: 5.0000 mg | ORAL_TABLET | Freq: Every day | ORAL | 0 refills | Status: DC

## 2022-06-19 MED ORDER — AMLODIPINE BESYLATE 5 MG PO TABS
5.0000 mg | ORAL_TABLET | Freq: Once | ORAL | Status: AC
  Administered 2022-06-19: 5 mg via ORAL
  Filled 2022-06-19: qty 1

## 2022-06-19 MED ORDER — ACETAMINOPHEN 500 MG PO TABS
1000.0000 mg | ORAL_TABLET | Freq: Once | ORAL | Status: AC
  Administered 2022-06-19: 1000 mg via ORAL
  Filled 2022-06-19: qty 2

## 2022-06-19 MED ORDER — METOCLOPRAMIDE HCL 5 MG/ML IJ SOLN
10.0000 mg | Freq: Once | INTRAMUSCULAR | Status: AC
  Administered 2022-06-19: 10 mg via INTRAVENOUS
  Filled 2022-06-19: qty 2

## 2022-06-19 MED ORDER — KETOROLAC TROMETHAMINE 15 MG/ML IJ SOLN
15.0000 mg | Freq: Once | INTRAMUSCULAR | Status: AC
Start: 2022-06-19 — End: 2022-06-19
  Administered 2022-06-19: 15 mg via INTRAVENOUS
  Filled 2022-06-19: qty 1

## 2022-06-19 NOTE — ED Provider Notes (Signed)
[place] EMERGENCY DEPARTMENT AT [facility] Provider Note   CSN: [id number] Arrival date & time: 06/19/22  2040     History  Chief Complaint  Patient presents with   Hypertension   Headache    Charlene Evans is a 52 y.o. female.  Patient is a 52 year old female with a past medical history of thalassemia presenting to the emergency department with headaches and high blood pressure.  Patient states that she does get frequent headaches due to migraines and her sinuses states that she has had a different type of headache over the last 2 days.  She states that she has a normal frontal headache but now feels radiation of the headache down her left side and her bilateral neck.  She states that she feels a burning type of pain in her forehead.  She states that she checked her blood pressure at home and it is initially in the 123456 systolic and when she rechecked it it was up to the A999333 systolic which prompted her to come to the emergency department.  She states that she has had higher blood pressure readings in the past but has never been diagnosed with hypertension and has never had blood pressure this high.  She denies any associated vision changes, nausea, vomiting, numbness or weakness.  She denies any chance of pregnancy.  The history is provided by the patient.  Hypertension Associated symptoms include headaches.  Headache      Home Medications Prior to Admission medications   Medication Sig Start Date End Date Taking? Authorizing Provider  amLODipine (NORVASC) 5 MG tablet Take 1 tablet (5 mg total) by mouth daily. 06/19/22 07/19/22 Yes Kingsley, [place] K, DO  mometasone (NASONEX) 50 MCG/ACT nasal spray Place 2 sprays into the nose daily. Patient not taking: Reported on 06/19/2022 04/14/21 04/14/22  Minette Brine, FNP  NOREL AD 4-10-325 MG TABS Take 1 tablet by mouth 3 (three) times daily as needed. Patient not taking: Reported on 06/19/2022 04/14/21   Minette Brine, FNP   Vitamin D, Ergocalciferol, (DRISDOL) 1.25 MG (50000 UNIT) CAPS capsule Take 1 capsule (50,000 Units total) by mouth every 7 (seven) days. Patient not taking: Reported on 04/06/2021 04/05/21   Bary Castilla, NP      Allergies    Patient has no known allergies.    Review of Systems   Review of Systems  Neurological:  Positive for headaches.    Physical Exam Updated Vital Signs BP (!) 164/76   Pulse 65   Temp 98.5 F (36.9 C) (Oral)   Resp (!) 23   SpO2 100%  Physical Exam Vitals and nursing note reviewed.  Constitutional:      General: She is not in acute distress.    Appearance: She is well-developed.  HENT:     Head: Normocephalic and atraumatic.     Mouth/Throat:     Mouth: Mucous membranes are moist.     Pharynx: Oropharynx is clear.  Eyes:     Extraocular Movements: Extraocular movements intact.     Pupils: Pupils are equal, round, and reactive to light.  Neck:     Comments: No midline neck tenderness No carotid bruits Cardiovascular:     Rate and Rhythm: Normal rate and regular rhythm.     Heart sounds: Normal heart sounds.  Pulmonary:     Effort: Pulmonary effort is normal.     Breath sounds: Normal breath sounds.  Abdominal:     Palpations: Abdomen is soft.  Musculoskeletal:  General: Normal range of motion.     Cervical back: Normal range of motion and neck supple. No rigidity.  Skin:    General: Skin is warm and dry.  Neurological:     Mental Status: She is alert and oriented to person, place, and time.     GCS: GCS eye subscore is 4. GCS verbal subscore is 5. GCS motor subscore is 6.     Cranial Nerves: No cranial nerve deficit, dysarthria or facial asymmetry.     Sensory: No sensory deficit.     Motor: No weakness.  Psychiatric:        Mood and Affect: Mood normal.        Speech: Speech normal.        Behavior: Behavior normal.     ED Results / Procedures / Treatments   Labs (all labs ordered are listed, but only abnormal results  are displayed) Labs Reviewed  COMPREHENSIVE METABOLIC PANEL - Abnormal; Notable for the following components:      Result Value   Potassium 3.3 (*)    Glucose, Bld 113 (*)    Calcium 8.6 (*)    All other components within normal limits  CBC - Abnormal; Notable for the following components:   RBC 5.75 (*)    Hemoglobin 11.1 (*)    HCT 34.8 (*)    MCV 60.5 (*)    MCH 19.3 (*)    RDW 16.8 (*)    All other components within normal limits    EKG None  Radiology No results found.  Procedures Procedures    Medications Ordered in ED Medications  potassium chloride SA (KLOR-CON M) CR tablet 40 mEq (40 mEq Oral Given 06/19/22 2219)  amLODipine (NORVASC) tablet 5 mg (5 mg Oral Given 06/19/22 2219)  ketorolac (TORADOL) 15 MG/ML injection 15 mg (15 mg Intravenous Given 06/19/22 2220)  metoCLOPramide (REGLAN) injection 10 mg (10 mg Intravenous Given 06/19/22 2220)  acetaminophen (TYLENOL) tablet 1,000 mg (1,000 mg Oral Given 06/19/22 2219)    ED Course/ Medical Decision Making/ A&P Clinical Course as of 06/19/22 2252  Sun Jun 19, 2022  2252 Upon reassessment, the patient's headache has improved and blood pressure has improved to the 123456 systolic.  She will be started on amlodipine and recommended primary care follow-up. [VK]    Clinical Course User Index [VK] Kemper Durie, DO                             Medical Decision Making This patient presents to the ED with chief complaint(s) of headache, HTN with pertinent past medical history of thalassemia which further complicates the presenting complaint. The complaint involves an extensive differential diagnosis and also carries with it a high risk of complications and morbidity.    The differential diagnosis includes tension headache, migraine headache, HTN, no focal neurologic deficits and no sudden onset making ICH or mass effect unlikely, denies possibility of pregnancy making pre-eclampsia unlikely    Additional history  obtained: Additional history obtained from N/A Records reviewed Primary Care Documents  ED Course and Reassessment: Patient was initially evaluated by provider in triage and had labs performed that showed no evidence of endorgan damage.  She had mild hypokalemia which will be repleted.  The patient will be treated with a migraine cocktail.  Due to her hypertension here on multiple readings and high blood pressure readings at home, she will be started on amlodipine for blood pressure  control.  She has no focal neurologic deficits and no sudden onset of headaches and no signs of ICH or mass effect no head CT is not necessary at this time.  Independent labs interpretation:  The following labs were independently interpreted: Mild hypokalemia otherwise within normal range  Independent visualization of imaging: N/A  Consultation: - Consulted or discussed management/test interpretation w/ external professional: N/A  Consideration for admission or further workup: Patient has no emergent conditions requiring admission or further work-up at this time and is stable for discharge home with primary care follow-up  Social Determinants of health: N/A    Amount and/or Complexity of Data Reviewed Labs: ordered.  Risk OTC drugs. Prescription drug management.          Final Clinical Impression(s) / ED Diagnoses Final diagnoses:  Hypertension, unspecified type  Acute nonintractable headache, unspecified headache type    Rx / DC Orders ED Discharge Orders          Ordered    amLODipine (NORVASC) 5 MG tablet  Daily        06/19/22 2237              Kemper Durie, DO 06/19/22 2252

## 2022-06-19 NOTE — Discharge Instructions (Addendum)
You were seen in the emergency department for your headache and your high blood pressure.  You had no signs of stroke or kidney injury from your blood pressure being high.  We started you on blood pressure medication in the emergency department and treated your headache and you had some improvement of your blood pressure.  You should continue to take the blood pressure medication as prescribed.  You can monitor your blood pressure at home and I recommend checking your blood pressure once daily at the same time every day when you have been resting for at least 15 to 20 minutes.  You can keep a log of your blood pressure readings and bring them to your primary doctor for follow-up to see if you need any medication changes.  You should return to the emergency department if you have significantly worsening headaches, repetitive vomiting, numbness or weakness on one side of the body compared to the other or any other new or concerning symptoms.

## 2022-06-19 NOTE — ED Triage Notes (Signed)
Pt states concern for HTN and headache that started yesterday. Home BP 160/96 in the morning and 191/103 this afternoon. Denies HX of HTN.

## 2022-06-22 ENCOUNTER — Telehealth: Payer: Self-pay

## 2022-06-22 NOTE — Transitions of Care (Post Inpatient/ED Visit) (Signed)
   06/22/2022  Name: EVANELL WYNKOOP MRN: [id number] DOB: August 19, 1970  Today's TOC FU Call Status: Today's TOC FU Call Status:: Unsuccessul Call (1st Attempt) Unsuccessful Call (1st Attempt) Date: 06/22/22  Attempted to reach the patient regarding the most recent Inpatient/ED visit.  Follow Up Plan: Additional outreach attempts will be made to reach the patient to complete the Transitions of Care (Post Inpatient/ED visit) call.   Signature Tami Lin

## 2022-07-13 ENCOUNTER — Encounter: Payer: Self-pay | Admitting: Nurse Practitioner

## 2022-07-13 ENCOUNTER — Other Ambulatory Visit (HOSPITAL_COMMUNITY)
Admission: RE | Admit: 2022-07-13 | Discharge: 2022-07-13 | Disposition: A | Discharge status: Home / Self Care | Payer: Commercial Managed Care - PPO | Source: Ambulatory Visit | Attending: Nurse Practitioner | Admitting: Nurse Practitioner

## 2022-07-13 ENCOUNTER — Ambulatory Visit (INDEPENDENT_AMBULATORY_CARE_PROVIDER_SITE_OTHER): Payer: Commercial Managed Care - PPO | Admitting: Nurse Practitioner

## 2022-07-13 VITALS — BP 128/72 | HR 74 | Temp 98.4°F | Ht 67.0 in | Wt 213.0 lb

## 2022-07-13 DIAGNOSIS — N898 Other specified noninflammatory disorders of vagina: Secondary | ICD-10-CM

## 2022-07-13 DIAGNOSIS — E559 Vitamin D deficiency, unspecified: Secondary | ICD-10-CM | POA: Diagnosis not present

## 2022-07-13 DIAGNOSIS — R5383 Other fatigue: Secondary | ICD-10-CM

## 2022-07-13 DIAGNOSIS — R7309 Other abnormal glucose: Secondary | ICD-10-CM | POA: Diagnosis not present

## 2022-07-13 DIAGNOSIS — E876 Hypokalemia: Secondary | ICD-10-CM

## 2022-07-13 DIAGNOSIS — M542 Cervicalgia: Secondary | ICD-10-CM

## 2022-07-13 MED ORDER — METRONIDAZOLE 500 MG PO TABS: 500.0000 mg | ORAL_TABLET | Freq: Three times a day (TID) | ORAL | 0 refills | Status: AC

## 2022-07-13 NOTE — Progress Notes (Signed)
I,Sheena H Holbrook,acting as a Education administrator for Minette Brine, FNP.,have documented all relevant documentation on the behalf of Minette Brine, FNP,as directed by  Minette Brine, FNP while in the presence of Minette Brine, [place].    Subjective:     Patient ID: Charlene Evans , female    DOB: [birth year] , 52 y.o.   MRN: [id number]   Chief Complaint  Patient presents with   Medical Management of Chronic Issues    HPI  Patient presents today for hypertension follow up. She had been to the ER with a headache. Nothing was helping. At home her blood pressure is around 130's/70's with the amlodipine. Has  stopped taking in the last 2 days.   Patient reports increased fatigue, would like labs done.   She is also having vaginal odor and is concerned about BV; denies pain/burning/itching/discharge.   Patient would like referral to orthopedic for neck pain, states she has pain and pressure from her neck into her head when she stretches in the mornings; does resolve. She has not done any massages, no heat or ice packs. Does not last long.   Patient also reports issues with episodes of weakness, can last 30-40 mins; is able to speak but cannot move. She had an episode around covid and seen at ER, thought to be a panic attack. March last year had episode similar to this. Then had second episode when at her daughters house.   Patient also concerned about having Raynaud's, reports her hands turning purple/white and always cold.   Patient has not been seen in the office in approximately a year and a half.  She is working 3 jobs - [employer], she will drive to [place] to work and work in [employer] - 06/25/2022 - she admits that her menstrual cycles are heavy - lasting 5 days.      Past Medical History:  Diagnosis Date   Anemia    Anxiety    hx of   Thalassemia      Family History  Problem Relation Age of Onset   Diabetes Mother    Hypertension Mother    Cancer Father    Pancreatic cancer Maternal  Grandmother    Colon polyps Maternal Grandmother    Diabetes Maternal Grandmother    Cancer Maternal Grandfather    Diabetes Maternal Grandfather    Hypertension Maternal Grandfather    Colon cancer Neg Hx    Esophageal cancer Neg Hx    Stomach cancer Neg Hx    Rectal cancer Neg Hx      Current Outpatient Medications:    amLODipine (NORVASC) 5 MG tablet, Take 1 tablet (5 mg total) by mouth daily., Disp: 30 tablet, Rfl: 0   metroNIDAZOLE (FLAGYL) 500 MG tablet, Take 1 tablet (500 mg total) by mouth 3 (three) times daily for 10 days., Disp: 30 tablet, Rfl: 0   No Known Allergies   Review of Systems  Constitutional:  Positive for fatigue.  Respiratory: Negative.    Cardiovascular: Negative.  Negative for chest pain, palpitations and leg swelling.  Gastrointestinal: Negative.   Neurological:  Positive for dizziness and weakness (intermittent episodes). Negative for headaches.  Psychiatric/Behavioral: Negative.       Today's Vitals   07/13/22 0919  BP: 128/72  Pulse: 74  Temp: 98.4 F (36.9 C)  TempSrc: Oral  SpO2: 99%  Weight: 213 lb (96.6 kg)  Height: 5\' 7"  (1.702 m)   Body mass index is 33.36 kg/m.   Objective:  Physical  Exam Vitals reviewed.  Constitutional:      General: She is not in acute distress.    Appearance: Normal appearance. She is obese.  Cardiovascular:     Rate and Rhythm: Normal rate and regular rhythm.     Pulses: Normal pulses.     Heart sounds: Normal heart sounds. No murmur heard. Pulmonary:     Effort: Pulmonary effort is normal. No respiratory distress.     Breath sounds: Normal breath sounds. No wheezing.  Musculoskeletal:     Cervical back: Normal range of motion and neck supple. No rigidity or tenderness.  Lymphadenopathy:     Cervical: No cervical adenopathy.  Skin:    General: Skin is warm and dry.     Capillary Refill: Capillary refill takes less than 2 seconds.  Neurological:     General: No focal deficit present.     Mental  Status: She is alert and oriented to person, place, and time.     Motor: No weakness.  Psychiatric:        Mood and Affect: Mood normal.        Behavior: Behavior normal.        Thought Content: Thought content normal.        Judgment: Judgment normal.         Assessment And Plan:     1. Fatigue, unspecified type Comments: Will check for metabolic causes, also discussed considering how much she is working. - Iron, TIBC and Ferritin Panel - Vitamin D (25 hydroxy) - TSH - Vitamin B12  2. Vitamin D deficiency  3. Abnormal glucose Comments: Hemoglobin A1c to make sure has not transitioned over to diabetes since having more  fatigue. - Hemoglobin A1c  4. Hypokalemia Comments: Potassium has been low recently will check levels today - BMP8+eGFR  5. Vaginal odor Comments: Will check urine analysis for BV, yeast. - Urine cytology ancillary only - metroNIDAZOLE (FLAGYL) 500 MG tablet; Take 1 tablet (500 mg total) by mouth 3 (three) times daily for 10 days.  Dispense: 30 tablet; Refill: 0  6. Cervicalgia Comments: No abnormal findings on physical exam, will refer to Orthopedics for further evaluation at patient request. - Ambulatory referral to Orthopedic Surgery     Patient was given opportunity to ask questions. Patient verbalized understanding of the plan and was able to repeat key elements of the plan. All questions were answered to their satisfaction.  Minette Brine, FNP   I, Minette Brine, FNP, have reviewed all documentation for this visit. The documentation on 07/13/22 for the exam, diagnosis, procedures, and orders are all accurate and complete.   IF YOU HAVE BEEN REFERRED TO A SPECIALIST, IT MAY TAKE 1-2 WEEKS TO SCHEDULE/PROCESS THE REFERRAL. IF YOU HAVE NOT HEARD FROM US/SPECIALIST IN TWO WEEKS, PLEASE GIVE [place] A CALL AT [phone].   THE PATIENT IS ENCOURAGED TO PRACTICE SOCIAL DISTANCING DUE TO THE COVID-19 PANDEMIC.

## 2022-07-13 NOTE — Patient Instructions (Signed)
If you have an episode of the discoloration to hands take a picture and send on MyChart.

## 2022-07-14 LAB — BMP8+EGFR
BUN/Creatinine Ratio: 14 (ref 9–23)
BUN: 12 mg/dL (ref 6–24)
CO2: 21 mmol/L (ref 20–29)
Calcium: 9.3 mg/dL (ref 8.7–10.2)
Chloride: 105 mmol/L (ref 96–106)
Creatinine, Ser: 0.85 mg/dL (ref 0.57–1.00)
Glucose: 95 mg/dL (ref 70–99)
Potassium: 4.4 mmol/L (ref 3.5–5.2)
Sodium: 140 mmol/L (ref 134–144)
eGFR: 82 mL/min/{1.73_m2} (ref >59)

## 2022-07-14 LAB — IRON,TIBC AND FERRITIN PANEL
Ferritin: 25 ng/mL (ref 15–150)
Iron Saturation: 14 % — ABNORMAL LOW (ref 15–55)
Iron: 45 ug/dL (ref 27–159)
Total Iron Binding Capacity: 322 ug/dL (ref 250–450)
UIBC: 277 ug/dL (ref 131–425)

## 2022-07-14 LAB — TSH: TSH: 1.84 u[IU]/mL (ref 0.450–4.500)

## 2022-07-14 LAB — HEMOGLOBIN A1C
Est. average glucose Bld gHb Est-mCnc: 134 mg/dL
Hgb A1c MFr Bld: 6.3 % — ABNORMAL HIGH (ref 4.8–5.6)

## 2022-07-14 LAB — VITAMIN D 25 HYDROXY (VIT D DEFICIENCY, FRACTURES): Vit D, 25-Hydroxy: 18.3 ng/mL — ABNORMAL LOW (ref 30.0–100.0)

## 2022-07-14 LAB — VITAMIN B12: Vitamin B-12: 546 pg/mL (ref 232–1245)

## 2022-07-15 LAB — URINE CYTOLOGY ANCILLARY ONLY
Bacterial Vaginitis-Urine: NEGATIVE
Candida Urine: NEGATIVE

## 2022-07-19 MED ORDER — VITAMIN D (ERGOCALCIFEROL) 1.25 MG (50000 UNIT) PO CAPS: 50000.0000 [IU] | ORAL_CAPSULE | ORAL | 1 refills | Status: DC

## 2022-12-01 ENCOUNTER — Encounter: Payer: 59 | Admitting: Nurse Practitioner

## 2023-01-07 ENCOUNTER — Other Ambulatory Visit: Payer: Self-pay

## 2023-01-07 ENCOUNTER — Emergency Department (HOSPITAL_COMMUNITY)
Admission: EM | Admit: 2023-01-07 | Discharge: 2023-01-07 | Disposition: A | Discharge status: Home / Self Care | Payer: Commercial Managed Care - PPO | Attending: Student | Admitting: Student

## 2023-01-07 ENCOUNTER — Encounter (HOSPITAL_COMMUNITY): Payer: Self-pay | Admitting: Pharmacy Technician

## 2023-01-07 DIAGNOSIS — Z1152 Encounter for screening for COVID-19: Secondary | ICD-10-CM | POA: Insufficient documentation

## 2023-01-07 DIAGNOSIS — I10 Essential (primary) hypertension: Secondary | ICD-10-CM | POA: Insufficient documentation

## 2023-01-07 DIAGNOSIS — R519 Headache, unspecified: Secondary | ICD-10-CM | POA: Diagnosis present

## 2023-01-07 DIAGNOSIS — R030 Elevated blood-pressure reading, without diagnosis of hypertension: Secondary | ICD-10-CM

## 2023-01-07 LAB — SARS CORONAVIRUS 2 BY RT PCR: SARS Coronavirus 2 by RT PCR: NEGATIVE

## 2023-01-07 MED ORDER — KETOROLAC TROMETHAMINE 15 MG/ML IJ SOLN
15.0000 mg | Freq: Once | INTRAMUSCULAR | Status: AC
  Administered 2023-01-07: 15 mg via INTRAVENOUS
  Filled 2023-01-07: qty 1

## 2023-01-07 MED ORDER — SODIUM CHLORIDE 0.9 % IV BOLUS
500.0000 mL | Freq: Once | INTRAVENOUS | Status: AC
  Administered 2023-01-07: 500 mL via INTRAVENOUS

## 2023-01-07 MED ORDER — SODIUM CHLORIDE 0.9 % IV SOLN
12.5000 mg | Freq: Once | INTRAVENOUS | Status: AC
  Administered 2023-01-07: 12.5 mg via INTRAVENOUS
  Filled 2023-01-07: qty 12.5

## 2023-01-07 NOTE — ED Provider Notes (Signed)
Patient awaiting p.o. challenge at shift change. Physical Exam  BP (!) 163/72 (BP Location: Right Arm)   Pulse 86   Temp 98.1 F (36.7 C) (Oral)   Resp 17   SpO2 100%     Procedures  Procedures  ED Course / MDM    Medical Decision Making Risk Prescription drug management.   Tolerated p.o. challenge without difficulty.  Discussed blood pressure diary and follow-up with PCP.  She is in agreement.       Marita [place], PA-C 01/07/23 2152    Terald Sleeper, MD 01/07/23 2300

## 2023-01-07 NOTE — ED Triage Notes (Signed)
Pt bib ems from work with reports of headache, fatigue for the last several days. Today pt with nausea. Given 4mg  Zofran with ems. 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

## 2023-01-07 NOTE — Discharge Instructions (Addendum)
You were seen in the ER today for headache.   Your COVID test was negative. I'm glad your headache improved after some medications given in the ER. Your blood pressure continued to be elevated, despite the fact that your pain improved, so I have lower concern that your headache was necessarily caused by your elevated blood pressure.  I strongly recommend following up with your primary doctor regarding your blood pressure readings. They find it beneficial to start you on blood pressure medication long term.  I suspect your headache could be from a viral infection. Please use acetaminophen (Tylenol) or ibuprofen (Advil, Motrin) for pain.  You may use 800 mg ibuprofen every 6 hours or 1000 mg of acetaminophen every 6 hours.  You may choose to alternate between the two, this would be most effective. Do not exceed 4000 mg of acetaminophen within 24 hours.  Do not exceed 3200 mg ibuprofen within 24 hours.  Continue to monitor how you're doing and return to the ER for new or worsening symptoms.

## 2023-01-07 NOTE — ED Notes (Addendum)
Pt given water for PO challenge, will reassess

## 2023-01-07 NOTE — ED Provider Notes (Signed)
[place] EMERGENCY DEPARTMENT AT [facility] Provider Note   CSN: [id number] Arrival date & time: 01/07/23  1827     History  Chief Complaint  Patient presents with   Headache   Fatigue   Nausea    Mabell A Tresner is a 52 y.o. female with history of HTN, anemia, thalassemia, and anxiety who presents to the ER complaining of headache and fatigue for several days, nausea starting today. Woke up with a headache earlier, felt her BP was high. Took amlodipine twice today, but does not take this regularly. Took 5 mg amlodipine at 0645 and 1430 today. Had an episode of feeling very weak while at work today, felt her heart was beating fast. No CP, SOB, syncope. No fever. 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 Given 4 mg zofran with EMS. Reports hx of migraines. Has not taken anything for her pain.    Headache Associated symptoms: fatigue and nausea        Home Medications Prior to Admission medications   Medication Sig Start Date End Date Taking? Authorizing Provider  amLODipine (NORVASC) 5 MG tablet Take 1 tablet (5 mg total) by mouth daily. 06/19/22 07/19/22  Rexford Maus, DO  Vitamin D, Ergocalciferol, (DRISDOL) 1.25 MG (50000 UNIT) CAPS capsule Take 1 capsule (50,000 Units total) by mouth 2 (two) times a week. 07/21/22   Arnette Felts, FNP      Allergies    Patient has no known allergies.    Review of Systems   Review of Systems  Constitutional:  Positive for fatigue.  Gastrointestinal:  Positive for nausea.  Neurological:  Positive for headaches.  All other systems reviewed and are negative.   Physical Exam Updated Vital Signs BP (!) 158/89 (BP Location: Left Arm)   Pulse 78   Temp 98.2 F (36.8 C) (Oral)   Resp (!) 21   SpO2 100%  Physical Exam Vitals and nursing note reviewed.  Constitutional:      Appearance: Normal appearance.  HENT:     Head: Normocephalic and atraumatic.     Comments: No temporal artery  tenderness Eyes:     General: Lids are normal.     Extraocular Movements: Extraocular movements intact.     Conjunctiva/sclera: Conjunctivae normal.     Pupils: Pupils are equal, round, and reactive to light.  Cardiovascular:     Rate and Rhythm: Normal rate and regular rhythm.  Pulmonary:     Effort: Pulmonary effort is normal. No respiratory distress.     Breath sounds: Normal breath sounds.  Abdominal:     General: There is no distension.     Palpations: Abdomen is soft.     Tenderness: There is no abdominal tenderness.  Skin:    General: Skin is warm and dry.  Neurological:     General: No focal deficit present.     Mental Status: She is alert.     Comments: Neuro: Speech is clear, able to follow commands. CN III-XII intact grossly intact. PERRLA. EOMI. Sensation intact throughout. Str 5/5 all extremities.     ED Results / Procedures / Treatments   Labs (all labs ordered are listed, but only abnormal results are displayed) Labs Reviewed  SARS CORONAVIRUS 2 BY RT PCR    EKG None  Radiology No results found.  Procedures Procedures    Medications Ordered in ED Medications  ketorolac (TORADOL) 15 MG/ML injection 15 mg (15 mg Intravenous Given 01/07/23 1928)  sodium chloride 0.9 % bolus 500 mL (500 mLs Intravenous Bolus 01/07/23 1928)  promethazine (PHENERGAN) 12.5 mg in sodium chloride 0.9 % 50 mL IVPB (12.5 mg Intravenous New Bag/Given 01/07/23 1928)    ED Course/ Medical Decision Making/ A&P                                 Medical Decision Making This patient is a 52 y.o. female  who presents to the ED for concern of headache.   Differential diagnoses prior to evaluation: The emergent differential diagnosis includes, but is not limited to,  Stroke, increased ICP, meningitis, CVA, intracranial tumor, venous sinus thrombosis, migraine, cluster headache, hypertension, drug related, head injury, tension headache, sinusitis, dental abscess, otitis media, TMJ,  temporal arteritis, glaucoma, trigeminal neuralgia, viral illness.. This is not an exhaustive differential.   Past Medical History / Co-morbidities / Social History: HTN, anemia, thalassemia  Additional history: Chart reviewed. Pertinent results include: ER visit from February of this year in which patient was given amlodipine for hypertension, with blood pressure readings in the 190s systolic.  She had followed up with her PCP the following month and they did not feel she needed to continue the amlodipine.  Patient had CT head/neck imaging in December 2019 for severe headache and left arm numbness, results were normal. Had CT head imaging prior to that in April 2019 that was unremarkable.  Physical Exam: Physical exam performed. The pertinent findings include: Hypertensive to 158/89, otherwise normal vital signs.  No acute distress.  Heart regular rate and rhythm, lung sounds clear.  Normal neurologic exam as above.  Lab Tests/Imaging studies: I personally interpreted labs/imaging and the pertinent results include:  COVID test negative.   Medications: I ordered medication including migraine cocktail including toradol, IVF, and phenergan.  I have reviewed the patients home medicines and have made adjustments as needed. Upon reevaluation, patient feels somewhat improved.    Disposition: After consideration of the diagnostic results and the patients response to treatment, I feel that emergency department workup does not suggest an emergent condition requiring admission or immediate intervention beyond what has been performed at this time. The plan is: discharge to home with symptomatic management of bad headache with elevated blood pressure readings. Very low concern for hypertensive urgency/emergency with current BP readings. Patient with reassuring neurologic exam, symptoms improving with migraine cocktail.   Patient discussed and care transferred to [facility] at shift change. Please see his/her  note for further details regarding further ED course and disposition. Plan at time of handoff is reevaluate after PO challenge. Anticipate dc to home with headache management and PCP follow up for BP management.   Final Clinical Impression(s) / ED Diagnoses Final diagnoses:  Bad headache  Elevated blood pressure reading    Rx / DC Orders ED Discharge Orders     None      Portions of this report may have been transcribed using voice recognition software. Every effort was made to ensure accuracy; however, inadvertent computerized transcription errors may be present.    Jeanella Flattery 01/07/23 2002    Glendora Score, MD 01/08/23 1345

## 2023-01-07 NOTE — ED Notes (Signed)
Pt completed PO challenge successfully

## 2023-01-24 ENCOUNTER — Encounter: Payer: Self-pay | Admitting: Nurse Practitioner

## 2023-01-24 ENCOUNTER — Ambulatory Visit (INDEPENDENT_AMBULATORY_CARE_PROVIDER_SITE_OTHER): Payer: Commercial Managed Care - PPO | Admitting: Nurse Practitioner

## 2023-01-24 VITALS — BP 120/80 | HR 77 | Temp 98.2°F | Ht 67.0 in | Wt 205.8 lb

## 2023-01-24 DIAGNOSIS — Z2821 Immunization not carried out because of patient refusal: Secondary | ICD-10-CM | POA: Insufficient documentation

## 2023-01-24 DIAGNOSIS — R7309 Other abnormal glucose: Secondary | ICD-10-CM | POA: Insufficient documentation

## 2023-01-24 DIAGNOSIS — I10 Essential (primary) hypertension: Secondary | ICD-10-CM | POA: Insufficient documentation

## 2023-01-24 DIAGNOSIS — E559 Vitamin D deficiency, unspecified: Secondary | ICD-10-CM | POA: Diagnosis not present

## 2023-01-24 DIAGNOSIS — G4489 Other headache syndrome: Secondary | ICD-10-CM | POA: Insufficient documentation

## 2023-01-24 DIAGNOSIS — Z79899 Other long term (current) drug therapy: Secondary | ICD-10-CM

## 2023-01-24 MED ORDER — AMLODIPINE BESYLATE 5 MG PO TABS: 5.0000 mg | ORAL_TABLET | Freq: Every day | ORAL | 1 refills | Status: DC

## 2023-01-24 MED ORDER — VITAMIN D (ERGOCALCIFEROL) 1.25 MG (50000 UNIT) PO CAPS: 50000.0000 [IU] | ORAL_CAPSULE | ORAL | 2 refills | Status: DC

## 2023-01-24 MED ORDER — NURTEC 75 MG PO TBDP: 1.0000 | ORAL_TABLET | Freq: Every day | ORAL | 2 refills | Status: AC | PRN

## 2023-01-24 NOTE — Assessment & Plan Note (Signed)
She may have a component of migraines vs HTN headache. Sent Rx for nurtec to take as needed. Due to hypertension she would not be able to take a triptan.

## 2023-01-24 NOTE — Assessment & Plan Note (Addendum)
This is a new diagnosis, she had not been taking the amlodipine due to her BP had normalized. It has since increased again with headaches, will now treat with amlodipine 5 mg daily. I have encouraged her to decrease her stress levels. Repeat blood pressure improved. Baseline EKG done  Sinus  Rhythm -  Nonspecific T-abnormality. Heart rate 62

## 2023-01-24 NOTE — Assessment & Plan Note (Signed)
HgbA1c was slightly more elevated at last visit. Will recheck.

## 2023-01-24 NOTE — Assessment & Plan Note (Signed)
Declines shingrix, educated on disease process and is aware if he changes his mind to notify office  

## 2023-01-24 NOTE — Assessment & Plan Note (Signed)
Will check vitamin D level and supplement as needed.    Also encouraged to spend 15 minutes in the sun daily.   

## 2023-01-24 NOTE — Assessment & Plan Note (Signed)
She plans to get at work

## 2023-01-24 NOTE — Progress Notes (Signed)
Madelaine Bhat, CMA,acting as a Neurosurgeon for Arnette Felts, FNP.,have documented all relevant documentation on the behalf of Arnette Felts, FNP,as directed by  Arnette Felts, FNP while in the presence of Arnette Felts, FNP.  Subjective:  Patient ID: Charlene Evans , female    DOB: January 26, 1971 , 52 y.o.   MRN: [id number]  Chief Complaint  Patient presents with   Hospitalization Follow-up    HPI  Patient presents today for a hospital follow up, Patient reports compliance with medication. Patient denies any chest pain, SOB, or headaches. Patient has no concerns today. Patient went to the hospital on 01/07/2023 for a bad headache and elevated bp reading. Patient reports she is feeling a little better but her bp is still going up some. She continues to work 3 jobs. She will have episodes of where her body will become "limp" and all the strength leaves her body. She would like a referral to cardiology due to feeling like when she stretches she feels like something cuts off.   BP Readings from Last 3 Encounters: 01/24/23 : (!) 130/90 01/07/23 : (!) 163/72 07/13/22 : 128/72       Past Medical History:  Diagnosis Date   Anemia    Anxiety    hx of   Thalassemia      Family History  Problem Relation Age of Onset   Diabetes Mother    Hypertension Mother    Cancer Father    Pancreatic cancer Maternal Grandmother    Colon polyps Maternal Grandmother    Diabetes Maternal Grandmother    Cancer Maternal Grandfather    Diabetes Maternal Grandfather    Hypertension Maternal Grandfather    Colon cancer Neg Hx    Esophageal cancer Neg Hx    Stomach cancer Neg Hx    Rectal cancer Neg Hx      Current Outpatient Medications:    Rimegepant Sulfate (NURTEC) 75 MG TBDP, Take 1 tablet (75 mg total) by mouth daily as needed., Disp: 16 tablet, Rfl: 2   amLODipine (NORVASC) 5 MG tablet, Take 1 tablet (5 mg total) by mouth daily., Disp: 90 tablet, Rfl: 1   [START ON 01/26/2023] Vitamin D, Ergocalciferol,  (DRISDOL) 1.25 MG (50000 UNIT) CAPS capsule, Take 1 capsule (50,000 Units total) by mouth 2 (two) times a week., Disp: 24 capsule, Rfl: 2   No Known Allergies   Review of Systems  Constitutional: Negative.   HENT: Negative.    Eyes: Negative.   Respiratory: Negative.    Cardiovascular: Negative.   Gastrointestinal: Negative.   Neurological:  Positive for headaches.  Psychiatric/Behavioral: Negative.       Today's Vitals   01/24/23 0858 01/24/23 0941  BP: (!) 130/90 120/80  Pulse: 77   Temp: 98.2 F (36.8 C)   TempSrc: Oral   Weight: 205 lb 12.8 oz (93.4 kg)   Height: 5\' 7"  (1.702 m)   PainSc: 4    PainLoc: Back    Body mass index is 32.23 kg/m.  Wt Readings from Last 3 Encounters:  01/24/23 205 lb 12.8 oz (93.4 kg)  07/13/22 213 lb (96.6 kg)  04/06/21 213 lb (96.6 kg)     Objective:  Physical Exam Vitals reviewed.  Constitutional:      General: She is not in acute distress.    Appearance: Normal appearance. She is obese.  Cardiovascular:     Rate and Rhythm: Normal rate and regular rhythm.     Pulses: Normal pulses.     Heart  sounds: Normal heart sounds. No murmur heard. Pulmonary:     Effort: Pulmonary effort is normal. No respiratory distress.     Breath sounds: Normal breath sounds. No wheezing.  Musculoskeletal:     Cervical back: Normal range of motion and neck supple. No rigidity or tenderness.  Lymphadenopathy:     Cervical: No cervical adenopathy.  Skin:    General: Skin is warm and dry.     Capillary Refill: Capillary refill takes less than 2 seconds.  Neurological:     General: No focal deficit present.     Mental Status: She is alert and oriented to person, place, and time.     Motor: No weakness.  Psychiatric:        Mood and Affect: Mood normal.        Behavior: Behavior normal.        Thought Content: Thought content normal.        Judgment: Judgment normal.         Assessment And Plan:  Essential hypertension Assessment &  Plan: This is a new diagnosis, she had not been taking the amlodipine due to her BP had normalized. It has since increased again with headaches, will now treat with amlodipine 5 mg daily. I have encouraged her to decrease her stress levels. Repeat blood pressure improved. Baseline EKG done  Sinus  Rhythm -  Nonspecific T-abnormality. Heart rate 62    Orders: -     EKG 12-Lead -     amLODIPine Besylate; Take 1 tablet (5 mg total) by mouth daily.  Dispense: 90 tablet; Refill: 1 -     BMP8+eGFR  Other headache syndrome Assessment & Plan: She may have a component of migraines vs HTN headache. Sent Rx for nurtec to take as needed. Due to hypertension she would not be able to take a triptan.   Orders: -     Nurtec; Take 1 tablet (75 mg total) by mouth daily as needed.  Dispense: 16 tablet; Refill: 2  Vitamin D deficiency Assessment & Plan: Will check vitamin D level and supplement as needed.    Also encouraged to spend 15 minutes in the sun daily.     Orders: -     Vitamin D (Ergocalciferol); Take 1 capsule (50,000 Units total) by mouth 2 (two) times a week.  Dispense: 24 capsule; Refill: 2 -     VITAMIN D 25 Hydroxy (Vit-D Deficiency, Fractures)  Influenza vaccination declined Assessment & Plan: She plans to get at work   Herpes zoster vaccination declined Assessment & Plan: Declines shingrix, educated on disease process and is aware if he changes his mind to notify office    Tetanus, diphtheria, and acellular pertussis (Tdap) vaccination declined  Abnormal glucose Assessment & Plan: HgbA1c was slightly more elevated at last visit. Will recheck.   Orders: -     Hemoglobin A1c  Other long term (current) drug therapy -     CBC    Return for BP f/u 3-66months check.  Patient was given opportunity to ask questions. Patient verbalized understanding of the plan and was able to repeat key elements of the plan. All questions were answered to their satisfaction.    Jeanell Sparrow, FNP, have reviewed all documentation for this visit. The documentation on 01/24/23 for the exam, diagnosis, procedures, and orders are all accurate and complete.   IF YOU HAVE BEEN REFERRED TO A SPECIALIST, IT MAY TAKE 1-2 WEEKS TO SCHEDULE/PROCESS THE REFERRAL. IF YOU HAVE NOT  HEARD FROM US/SPECIALIST IN TWO WEEKS, PLEASE GIVE [place] A CALL AT [phone].

## 2023-01-25 LAB — CBC
Hematocrit: 36.6 % (ref 34.0–46.6)
Hemoglobin: 10.9 g/dL — ABNORMAL LOW (ref 11.1–15.9)
MCH: 19 pg — ABNORMAL LOW (ref 26.6–33.0)
MCHC: 29.8 g/dL — ABNORMAL LOW (ref 31.5–35.7)
MCV: 64 fL — ABNORMAL LOW (ref 79–97)
Platelets: 390 10*3/uL (ref 150–450)
RBC: 5.73 x10E6/uL — ABNORMAL HIGH (ref 3.77–5.28)
RDW: 19.9 % — ABNORMAL HIGH (ref 11.7–15.4)
WBC: 7.5 10*3/uL (ref 3.4–10.8)

## 2023-01-25 LAB — VITAMIN D 25 HYDROXY (VIT D DEFICIENCY, FRACTURES): Vit D, 25-Hydroxy: 23.4 ng/mL — ABNORMAL LOW (ref 30.0–100.0)

## 2023-01-25 LAB — BMP8+EGFR
BUN/Creatinine Ratio: 18 (ref 9–23)
BUN: 15 mg/dL (ref 6–24)
CO2: 24 mmol/L (ref 20–29)
Calcium: 9.4 mg/dL (ref 8.7–10.2)
Chloride: 103 mmol/L (ref 96–106)
Creatinine, Ser: 0.85 mg/dL (ref 0.57–1.00)
Glucose: 92 mg/dL (ref 70–99)
Potassium: 4.9 mmol/L (ref 3.5–5.2)
Sodium: 139 mmol/L (ref 134–144)
eGFR: 82 mL/min/{1.73_m2} (ref >59)

## 2023-01-25 LAB — HEMOGLOBIN A1C
Est. average glucose Bld gHb Est-mCnc: 117 mg/dL
Hgb A1c MFr Bld: 5.7 % — ABNORMAL HIGH (ref 4.8–5.6)

## 2023-01-27 LAB — IRON AND TIBC
Iron Saturation: 17 % (ref 15–55)
Iron: 58 ug/dL (ref 27–159)
Total Iron Binding Capacity: 341 ug/dL (ref 250–450)
UIBC: 283 ug/dL (ref 131–425)

## 2023-01-27 LAB — FERRITIN: Ferritin: 32 ng/mL (ref 15–150)

## 2023-01-27 LAB — SPECIMEN STATUS REPORT

## 2023-03-02 ENCOUNTER — Ambulatory Visit (INDEPENDENT_AMBULATORY_CARE_PROVIDER_SITE_OTHER): Payer: Commercial Managed Care - PPO | Admitting: Family Medicine

## 2023-03-02 ENCOUNTER — Encounter: Payer: Self-pay | Admitting: Family Medicine

## 2023-03-02 VITALS — BP 124/80 | HR 67 | Temp 98.4°F | Ht 67.0 in | Wt 207.0 lb

## 2023-03-02 DIAGNOSIS — Z79899 Other long term (current) drug therapy: Secondary | ICD-10-CM

## 2023-03-02 DIAGNOSIS — E559 Vitamin D deficiency, unspecified: Secondary | ICD-10-CM | POA: Diagnosis not present

## 2023-03-02 DIAGNOSIS — Z0000 Encounter for general adult medical examination without abnormal findings: Secondary | ICD-10-CM | POA: Diagnosis not present

## 2023-03-02 DIAGNOSIS — I10 Essential (primary) hypertension: Secondary | ICD-10-CM | POA: Diagnosis not present

## 2023-03-02 DIAGNOSIS — R7309 Other abnormal glucose: Secondary | ICD-10-CM

## 2023-03-02 DIAGNOSIS — E66811 Obesity, class 1: Secondary | ICD-10-CM

## 2023-03-02 DIAGNOSIS — Z83430 Family history of elevated lipoprotein(a): Secondary | ICD-10-CM

## 2023-03-02 DIAGNOSIS — Z1231 Encounter for screening mammogram for malignant neoplasm of breast: Secondary | ICD-10-CM

## 2023-03-02 DIAGNOSIS — Z6832 Body mass index (BMI) 32.0-32.9, adult: Secondary | ICD-10-CM

## 2023-03-02 DIAGNOSIS — R5383 Other fatigue: Secondary | ICD-10-CM

## 2023-03-02 NOTE — Progress Notes (Signed)
I,Jameka J Llittleton, CMA,acting as a Neurosurgeon for [employer], NP.,have documented all relevant documentation on the behalf of Ellender Hose, NP,as directed by  Ellender Hose, NP while in the presence of Ellender Hose, NP.  Subjective:    Patient ID: Charlene Evans , female    DOB: 08/09/1970 , 52 y.o.   MRN: [id number]  Chief Complaint  Patient presents with   Annual Exam    HPI  Patient is a 52 year old female who presents today for her annual  physical exam. Patient  states she has been compliant with for medication for hypertension.Patient had labs drawn her last visit on 01/24/2023. Patient does not have any questions or concerns at this time.      Past Medical History:  Diagnosis Date   Anemia    Anxiety    hx of   Thalassemia      Family History  Problem Relation Age of Onset   Diabetes Mother    Hypertension Mother    Cancer Father    Pancreatic cancer Maternal Grandmother    Colon polyps Maternal Grandmother    Diabetes Maternal Grandmother    Cancer Maternal Grandfather    Diabetes Maternal Grandfather    Hypertension Maternal Grandfather    Colon cancer Neg Hx    Esophageal cancer Neg Hx    Stomach cancer Neg Hx    Rectal cancer Neg Hx      Current Outpatient Medications:    amLODipine (NORVASC) 5 MG tablet, Take 1 tablet (5 mg total) by mouth daily., Disp: 90 tablet, Rfl: 1   Rimegepant Sulfate (NURTEC) 75 MG TBDP, Take 1 tablet (75 mg total) by mouth daily as needed., Disp: 16 tablet, Rfl: 2   Vitamin D, Ergocalciferol, (DRISDOL) 1.25 MG (50000 UNIT) CAPS capsule, Take 1 capsule (50,000 Units total) by mouth 2 (two) times a week., Disp: 24 capsule, Rfl: 2   No Known Allergies    The patient states she uses none for birth control. No LMP recorded.. Negative for Dysmenorrhea. Negative for: breast discharge, breast lump(s), breast pain and breast self exam. Associated symptoms include abnormal vaginal bleeding. Pertinent negatives include abnormal bleeding  (hematology), anxiety, decreased libido, depression, difficulty falling sleep, dyspareunia, history of infertility, nocturia, sexual dysfunction, sleep disturbances, urinary incontinence, urinary urgency, vaginal discharge and vaginal itching. Diet regular.The patient states her exercise level is    . The patient's tobacco use is:  Social History   Tobacco Use  Smoking Status Never  Smokeless Tobacco Never  . She has been exposed to passive smoke. The patient's alcohol use is:  Social History   Substance and Sexual Activity  Alcohol Use No   Alcohol/week: 0.0 standard drinks of alcohol     Review of Systems  Constitutional: Negative.   HENT: Negative.    Eyes: Negative.   Respiratory: Negative.    Cardiovascular: Negative.   Gastrointestinal: Negative.   Endocrine: Negative.   Genitourinary: Negative.   Musculoskeletal: Negative.   Skin: Negative.   Allergic/Immunologic: Negative.   Neurological: Negative.   Hematological: Negative.   Psychiatric/Behavioral: Negative.       Today's Vitals   03/02/23 0931  BP: 124/80  Pulse: 67  Temp: 98.4 F (36.9 C)  Weight: 207 lb (93.9 kg)  Height: 5\' 7"  (1.702 m)  PainSc: 0-No pain   Body mass index is 32.42 kg/m.  Wt Readings from Last 3 Encounters:  03/02/23 207 lb (93.9 kg)  01/24/23 205 lb 12.8 oz (93.4 kg)  07/13/22 213 lb (96.6 kg)     Objective:  Physical Exam Constitutional:      Appearance: Normal appearance.  HENT:     Head: Normocephalic.  Cardiovascular:     Rate and Rhythm: Normal rate and regular rhythm.     Pulses: Normal pulses.     Heart sounds: Normal heart sounds.  Pulmonary:     Effort: Pulmonary effort is normal.     Breath sounds: Normal breath sounds.  Abdominal:     General: Bowel sounds are normal.  Musculoskeletal:        General: Normal range of motion.  Skin:    General: Skin is warm and dry.  Neurological:     General: No focal deficit present.     Mental Status: She is alert and  oriented to person, place, and time. Mental status is at baseline.  Psychiatric:        Mood and Affect: Mood normal.         Assessment And Plan:     Encounter for general adult medical examination w/o abnormal findings  Essential hypertension  Vitamin D deficiency  Abnormal glucose  Other long term (current) drug therapy  Class 1 obesity with body mass index (BMI) of 32.0 to 32.9 in adult, unspecified obesity type, unspecified whether serious comorbidity present  Screening mammogram for breast cancer -     Digital Screening Mammogram, Left and Right; Future  Family history of elevated lipoprotein (a) -     Lipid panel     Return in 6 months (on 08/30/2023) for 1 year physical, blood pressure. Patient was given opportunity to ask questions. Patient verbalized understanding of the plan and was able to repeat key elements of the plan. All questions were answered to their satisfaction.   Ellender Hose, NP  I, Ellender Hose, NP, have reviewed all documentation for this visit. The documentation on 03/07/23 for the exam, diagnosis, procedures, and orders are all accurate and complete.

## 2023-03-03 LAB — LIPID PANEL
Chol/HDL Ratio: 2.7 ratio (ref 0.0–4.4)
Cholesterol, Total: 187 mg/dL (ref 100–199)
HDL: 70 mg/dL (ref >39)
LDL Chol Calc (NIH): 104 mg/dL — ABNORMAL HIGH (ref 0–99)
Triglycerides: 73 mg/dL (ref 0–149)
VLDL Cholesterol Cal: 13 mg/dL (ref 5–40)

## 2023-03-07 DIAGNOSIS — R5383 Other fatigue: Secondary | ICD-10-CM | POA: Insufficient documentation

## 2023-03-07 DIAGNOSIS — Z79899 Other long term (current) drug therapy: Secondary | ICD-10-CM | POA: Insufficient documentation

## 2023-06-06 ENCOUNTER — Ambulatory Visit: Payer: Commercial Managed Care - PPO | Admitting: Nurse Practitioner

## 2023-06-20 ENCOUNTER — Ambulatory Visit: Payer: Self-pay | Admitting: Nurse Practitioner

## 2023-07-02 IMAGING — MG MM DIGITAL SCREENING BILAT W/ TOMO AND CAD
8 series · 8 of 24 positions shown · non-contrast
Comparison: Previous exam(s).

CLINICAL DATA: Screening.

EXAM:
DIGITAL SCREENING BILATERAL MAMMOGRAM WITH TOMOSYNTHESIS AND CAD
TECHNIQUE: Bilateral screening digital craniocaudal and mediolateral oblique
mammograms were obtained. Bilateral screening digital breast
tomosynthesis was performed. The images were evaluated with
computer-aided detection.

[R CC synth-2D]
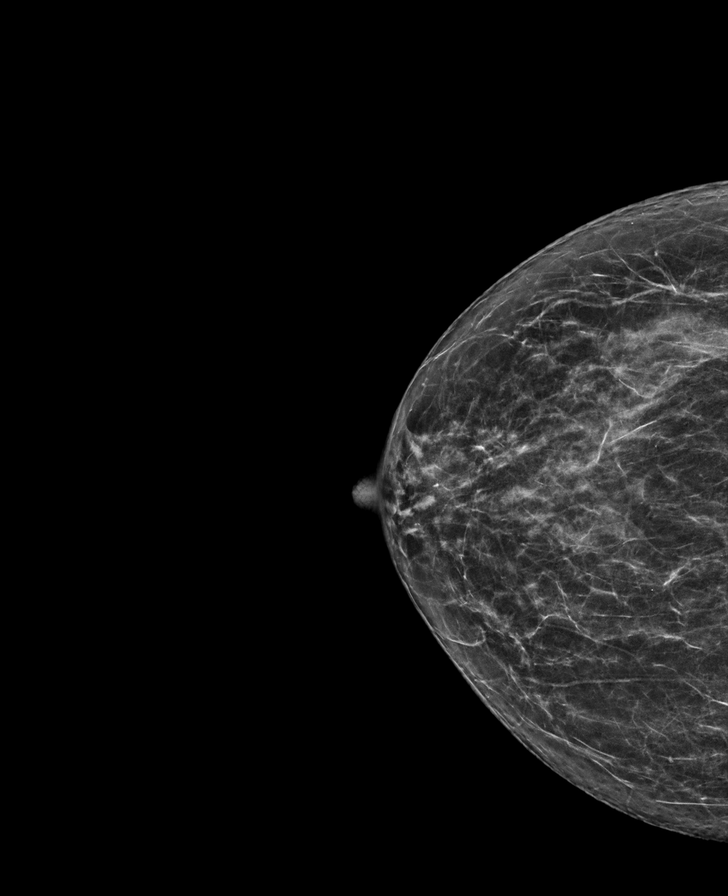

[R MLO synth-2D]
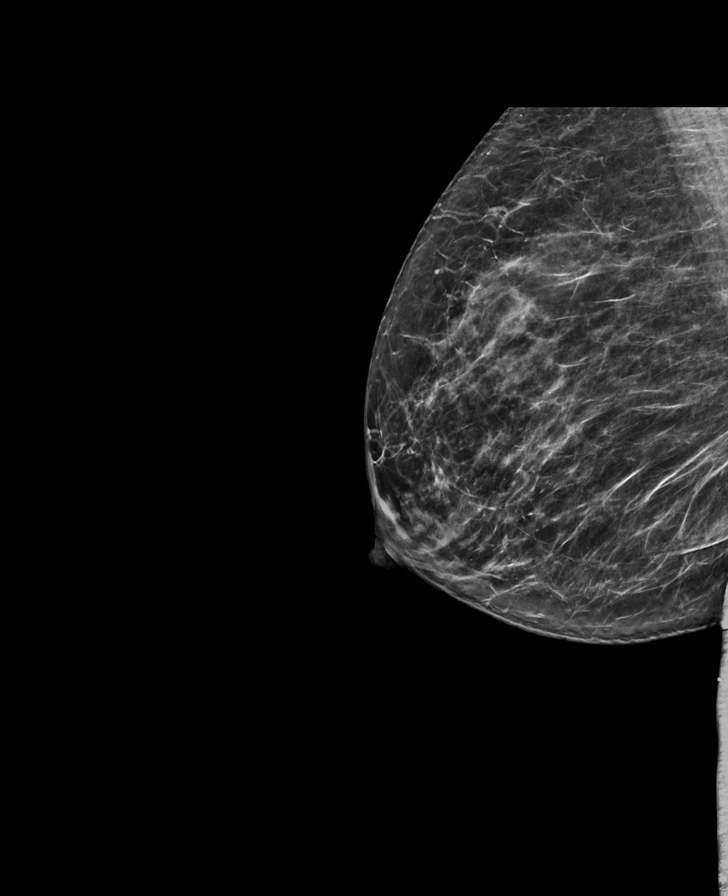

[L MLO synth-2D]
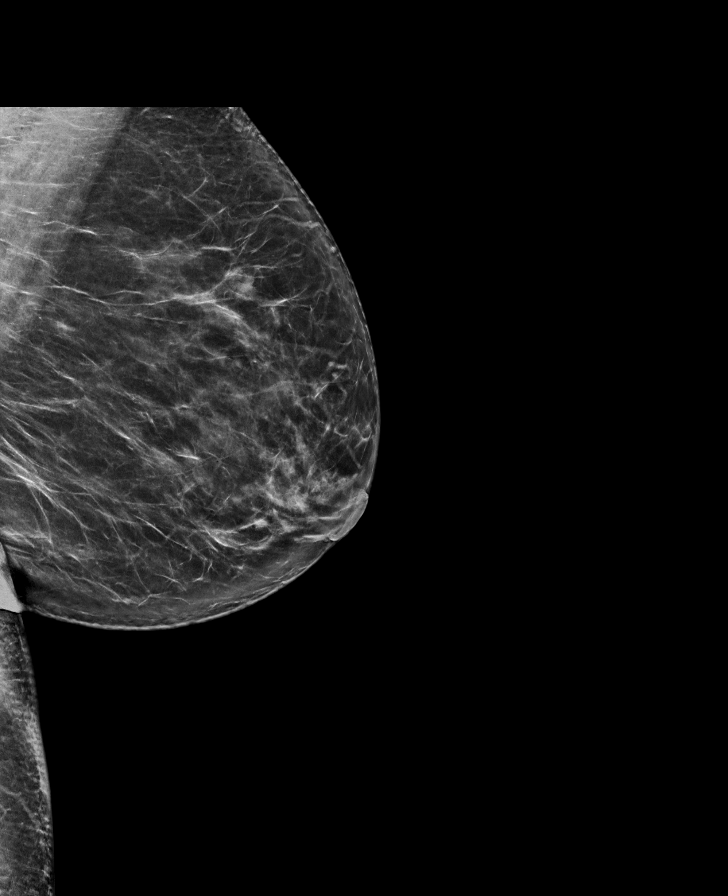

[L CC synth-2D]
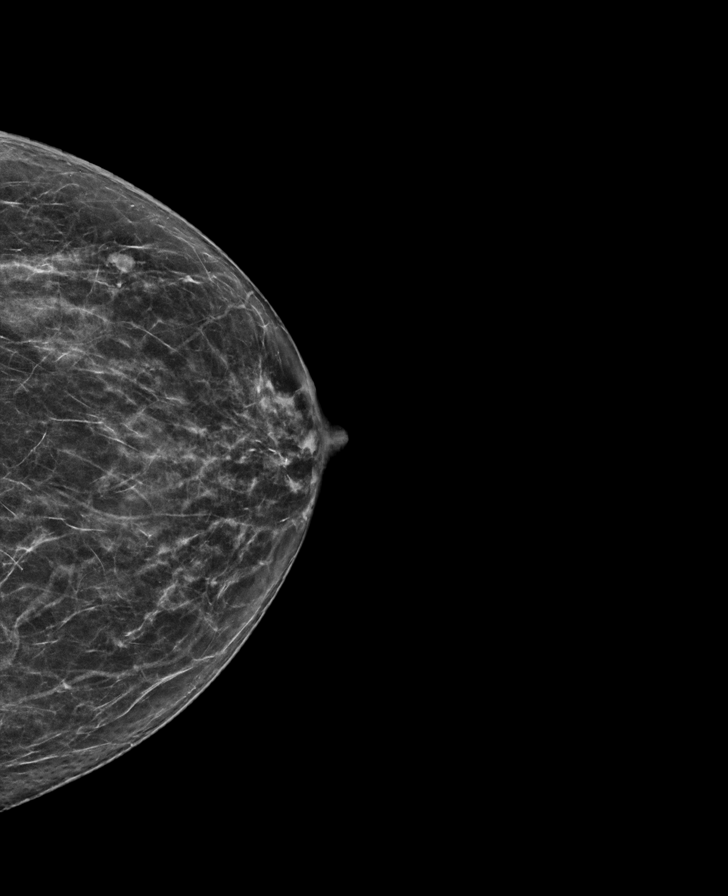

[L MLO tomo · tomo slice 40/79.0]
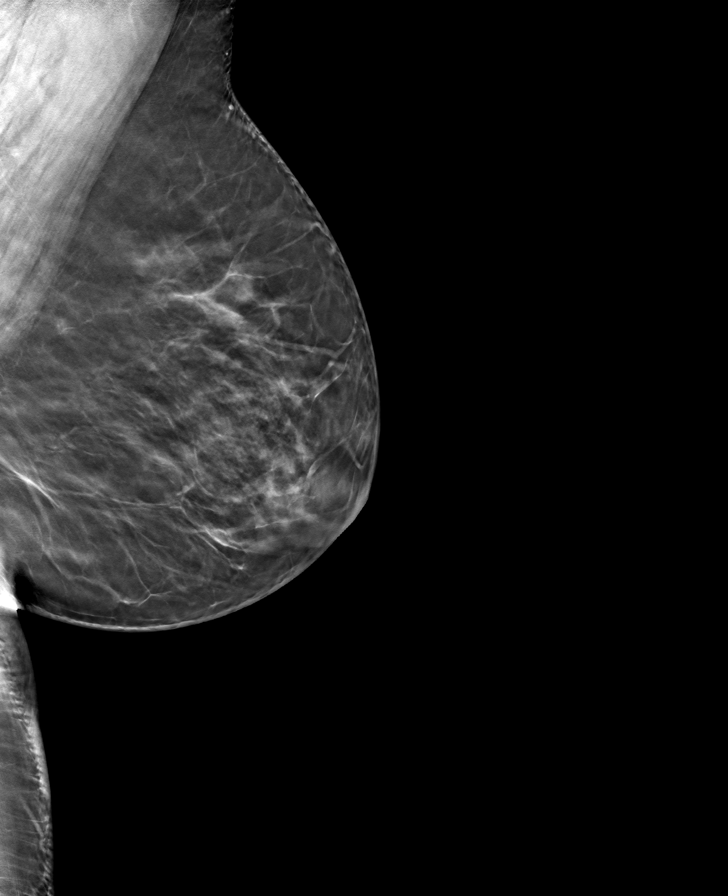

[L CC tomo · tomo slice 31/62.0]
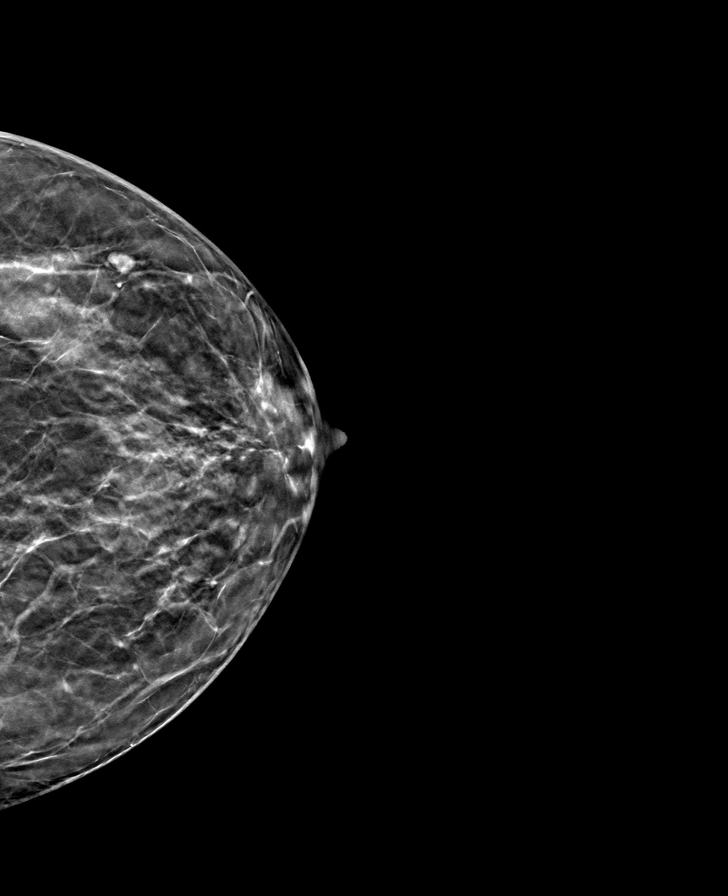

[R MLO tomo · tomo slice 37/73.0]
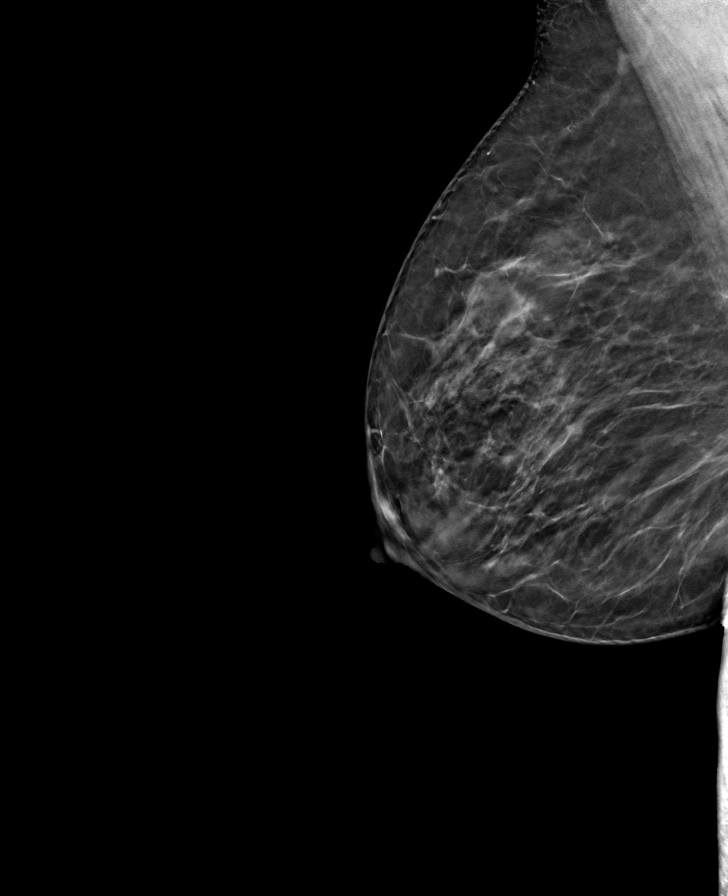

[R CC tomo · tomo slice 31/60.0]
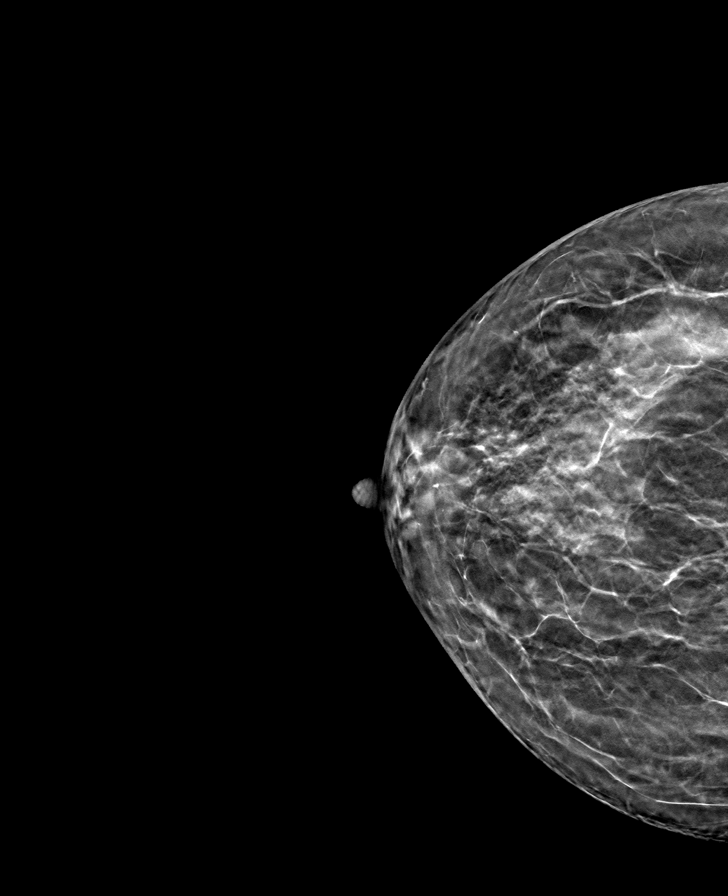

[8 of 24 positions shown; findings below may reference images not displayed]

ACR Breast Density Category b: There are scattered areas of
fibroglandular density.
FINDINGS: There are no findings suspicious for malignancy.
IMPRESSION: No mammographic evidence of malignancy. A result letter of this
screening mammogram will be mailed directly to the patient.

RECOMMENDATION:
Screening mammogram in one year. (Code:51-O-LD2)

BI-RADS CATEGORY  1: Negative.

## 2024-03-05 ENCOUNTER — Encounter: Payer: Commercial Managed Care - PPO | Admitting: Nurse Practitioner

## 2024-04-16 ENCOUNTER — Encounter: Payer: Self-pay | Admitting: Internal Medicine

## 2024-04-16 ENCOUNTER — Ambulatory Visit (INDEPENDENT_AMBULATORY_CARE_PROVIDER_SITE_OTHER): Payer: Self-pay | Admitting: Internal Medicine

## 2024-04-16 VITALS — BP 128/82 | HR 79 | Temp 98.3°F | Ht 67.0 in | Wt 214.4 lb

## 2024-04-16 DIAGNOSIS — I10 Essential (primary) hypertension: Secondary | ICD-10-CM | POA: Diagnosis not present

## 2024-04-16 DIAGNOSIS — R0602 Shortness of breath: Secondary | ICD-10-CM

## 2024-04-16 DIAGNOSIS — E559 Vitamin D deficiency, unspecified: Secondary | ICD-10-CM

## 2024-04-16 DIAGNOSIS — D569 Thalassemia, unspecified: Secondary | ICD-10-CM | POA: Diagnosis not present

## 2024-04-16 DIAGNOSIS — Z8249 Family history of ischemic heart disease and other diseases of the circulatory system: Secondary | ICD-10-CM

## 2024-04-16 DIAGNOSIS — R7309 Other abnormal glucose: Secondary | ICD-10-CM

## 2024-04-16 MED ORDER — FOLIC ACID 1 MG PO TABS: 1.0000 mg | ORAL_TABLET | Freq: Every day | ORAL | 1 refills | Status: AC

## 2024-04-16 NOTE — Patient Instructions (Signed)
 Hypertension, Adult Hypertension is another name for high blood pressure. High blood pressure forces your heart to work harder to pump blood. This can cause problems over time. There are two numbers in a blood pressure reading. There is a top number (systolic) over a bottom number (diastolic). It is best to have a blood pressure that is below 120/80. What are the causes? The cause of this condition is not known. Some other conditions can lead to high blood pressure. What increases the risk? Some lifestyle factors can make you more likely to develop high blood pressure: Smoking. Not getting enough exercise or physical activity. Being overweight. Having too much fat, sugar, calories, or salt (sodium) in your diet. Drinking too much alcohol. Other risk factors include: Having any of these conditions: Heart disease. Diabetes. High cholesterol. Kidney disease. Obstructive sleep apnea. Having a family history of high blood pressure and high cholesterol. Age. The risk increases with age. Stress. What are the signs or symptoms? High blood pressure may not cause symptoms. Very high blood pressure (hypertensive crisis) may cause: Headache. Fast or uneven heartbeats (palpitations). Shortness of breath. Nosebleed. Vomiting or feeling like you may vomit (nauseous). Changes in how you see. Very bad chest pain. Feeling dizzy. Seizures. How is this treated? This condition is treated by making healthy lifestyle changes, such as: Eating healthy foods. Exercising more. Drinking less alcohol. Your doctor may prescribe medicine if lifestyle changes do not help enough and if: Your top number is above 130. Your bottom number is above 80. Your personal target blood pressure may vary. Follow these instructions at home: Eating and drinking  If told, follow the DASH eating plan. To follow this plan: Fill one half of your plate at each meal with fruits and vegetables. Fill one fourth of your plate  at each meal with whole grains. Whole grains include whole-wheat pasta, brown rice, and whole-grain bread. Eat or drink low-fat dairy products, such as skim milk or low-fat yogurt. Fill one fourth of your plate at each meal with low-fat (lean) proteins. Low-fat proteins include fish, chicken without skin, eggs, beans, and tofu. Avoid fatty meat, cured and processed meat, or chicken with skin. Avoid pre-made or processed food. Limit the amount of salt in your diet to less than 1,500 mg each day. Do not drink alcohol if: Your doctor tells you not to drink. You are pregnant, may be pregnant, or are planning to become pregnant. If you drink alcohol: Limit how much you have to: 0-1 drink a day for women. 0-2 drinks a day for men. Know how much alcohol is in your drink. In the [place] one drink equals one 12 oz bottle of beer (355 mL), one 5 oz glass of wine (148 mL), or one 1 oz glass of hard liquor (44 mL). Lifestyle  Work with your doctor to stay at a healthy weight or to lose weight. Ask your doctor what the best weight is for you. Get at least 30 minutes of exercise that causes your heart to beat faster (aerobic exercise) most days of the week. This may include walking, swimming, or biking. Get at least 30 minutes of exercise that strengthens your muscles (resistance exercise) at least 3 days a week. This may include lifting weights or doing Pilates. Do not smoke or use any products that contain nicotine or tobacco. If you need help quitting, ask your doctor. Check your blood pressure at home as told by your doctor. Keep all follow-up visits. Medicines Take over-the-counter and prescription medicines  only as told by your doctor. Follow directions carefully. Do not skip doses of blood pressure medicine. The medicine does not work as well if you skip doses. Skipping doses also puts you at risk for problems. Ask your doctor about side effects or reactions to medicines that you should watch  for. Contact a doctor if: You think you are having a reaction to the medicine you are taking. You have headaches that keep coming back. You feel dizzy. You have swelling in your ankles. You have trouble with your vision. Get help right away if: You get a very bad headache. You start to feel mixed up (confused). You feel weak or numb. You feel faint. You have very bad pain in your: Chest. Belly (abdomen). You vomit more than once. You have trouble breathing. These symptoms may be an emergency. Get help right away. Call 911. Do not wait to see if the symptoms will go away. Do not drive yourself to the hospital. Summary Hypertension is another name for high blood pressure. High blood pressure forces your heart to work harder to pump blood. For most people, a normal blood pressure is less than 120/80. Making healthy choices can help lower blood pressure. If your blood pressure does not get lower with healthy choices, you may need to take medicine. This information is not intended to replace advice given to you by your health care provider. Make sure you discuss any questions you have with your health care provider. Document Revised: 01/28/2021 Document Reviewed: 01/28/2021 Elsevier Patient Education  2024 [employer].

## 2024-04-16 NOTE — Assessment & Plan Note (Addendum)
 I WILL CHECK A VIT D LEVEL AND SUPPLEMENT AS NEEDED.  ALSO ENCOURAGED TO SPEND 15 MINUTES IN THE SUN DAILY.

## 2024-04-16 NOTE — Assessment & Plan Note (Addendum)
 Previous labs reviewed, her A1c has been elevated in the past. I will check an A1c today. Reminded to avoid refined sugars including sugary drinks/foods and processed meats including bacon, sausages and deli meats.

## 2024-04-16 NOTE — Progress Notes (Signed)
 I,Charlene Evans, CMA,acting as a neurosurgeon for Charlene LOISE Slocumb, MD.,have documented all relevant documentation on the behalf of Charlene LOISE Slocumb, MD,as directed by  Charlene LOISE Slocumb, MD while in the presence of Charlene LOISE Slocumb, MD.  Subjective:  Patient ID: Charlene Evans , female    DOB: 03-04-1971 , 53 y.o.   MRN: [id number]  Chief Complaint  Patient presents with   Hypertension    Patient presents today for bpc. She reports compliance with medications. Denies headache, chest pain & sob. She admits not taking medication like she should. She states having  spells where she basically goes limp  & weak to the extreme of someone having to drive her around. Most recent episode: last Monday.  She states sleeping this off, 10 hours or more. She did notice high bp on that morning.  Denies going to urgent care or ED.     HPI Discussed the use of AI scribe software for clinical note transcription with the patient, who gave verbal consent to proceed.  History of Present Illness Charlene Evans is a 53 year old female with hypertension who presents with episodes of weakness and elevated blood pressure.  She has been experiencing episodes of weakness and fatigue since 2020, characterized by a feeling of having 'no energy at all.' These episodes occur sporadically, with approximately six occurrences since she began. During these episodes, she feels the need to sit down and finds a position of comfort to help her body settle. Movement exacerbates her symptoms. These episodes started before her diagnosis of hypertension.  She has been taking amlodipine  for her hypertension but has been inconsistent with her medication adherence, previously taking it about three days a week. Since last Monday, she has been taking it daily. She attributes her inconsistent medication use to her busy work schedule and forgetfulness. Her daughter suggested taking the medication at night, but she prefers taking it during the  day to avoid missing doses.  In addition to the episodes of weakness, she experiences shortness of breath, particularly when walking up stairs, which she attributes to being out of shape. She has not been exercising regularly and has been trying to improve her diet by making smoothies and reducing eating out.  She recalls an episode last Monday where her blood pressure spiked to 185/104, accompanied by chest discomfort for the first time. She did not seek emergency care because previous visits to the hospital for similar episodes resulted in no treatment being provided. She is frustrated with being offered pain medication instead of treatment for her elevated blood pressure.  Her family history is significant for heart disease, with both maternal grandparents and her paternal grandmother having had heart-related issues. She is concerned about her risk of heart disease.  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 She acknowledges a decrease in physical activity since transitioning to a desk job and wants to lose weight and start exercising again.   Hypertension This is a chronic problem. The current episode started more than 1 year ago. Associated symptoms include shortness of breath. Pertinent negatives include no chest pain, headaches or palpitations. Past treatments include calcium channel blockers. Compliance problems include exercise.  There is no history of kidney disease or CAD/MI.     Past Medical History:  Diagnosis Date   Anemia    Anxiety    hx of   Thalassemia      Family History  Problem Relation Age of Onset   Diabetes Mother    Hypertension Mother    Cancer Father    Pancreatic cancer Maternal Grandmother    Colon polyps Maternal Grandmother    Diabetes Maternal Grandmother    Cancer Maternal Grandfather    Diabetes Maternal Grandfather    Hypertension Maternal Grandfather    Colon cancer Neg Hx    Esophageal  cancer Neg Hx    Stomach cancer Neg Hx    Rectal cancer Neg Hx      Current Outpatient Medications:    amLODipine  (NORVASC ) 5 MG tablet, Take 1 tablet (5 mg total) by mouth daily., Disp: 90 tablet, Rfl: 1   Rimegepant Sulfate (NURTEC) 75 MG TBDP, Take 1 tablet (75 mg total) by mouth daily as needed., Disp: 16 tablet, Rfl: 2   Vitamin D , Ergocalciferol , (DRISDOL ) 1.25 MG (50000 UNIT) CAPS capsule, Take 1 capsule (50,000 Units total) by mouth 2 (two) times a week., Disp: 24 capsule, Rfl: 2   folic acid  (FOLVITE ) 1 MG tablet, Take 1 tablet (1 mg total) by mouth daily., Disp: 90 tablet, Rfl: 1   No Known Allergies   Review of Systems  Constitutional: Negative.   Respiratory:  Positive for shortness of breath.   Cardiovascular:  Negative for chest pain and palpitations.  Gastrointestinal: Negative.   Neurological: Negative.  Negative for headaches.  Psychiatric/Behavioral: Negative.       Today's Vitals   04/16/24 0955 04/16/24 1006  BP: (!) 140/80 128/82  Pulse: 79   Temp: 98.3 F (36.8 C)   SpO2: 98%   Weight: 214 lb 6.4 oz (97.3 kg)   Height: 5' 7 (1.702 m)    Body mass index is 33.58 kg/m.  Wt Readings from Last 3 Encounters:  04/16/24 214 lb 6.4 oz (97.3 kg)  03/02/23 207 lb (93.9 kg)  01/24/23 205 lb 12.8 oz (93.4 kg)    The 10-year ASCVD risk score (Arnett DK, et al., 2019) is: 2.2%   Values used to calculate the score:     Age: 53 years     Clinically relevant sex: Female     Is Non-Hispanic African American: Yes     Diabetic: No     Tobacco smoker: No     Systolic Blood Pressure: 128 mmHg     Is BP treated: No     HDL Cholesterol: 65 mg/dL     Total Cholesterol: 174 mg/dL  Objective:  Physical Exam Vitals and nursing note reviewed.  Constitutional:      Appearance: Normal appearance. She is obese.  HENT:     Head: Normocephalic and atraumatic.  Eyes:     Extraocular Movements: Extraocular movements intact.  Cardiovascular:     Rate and Rhythm: Normal  rate and regular rhythm.     Heart sounds: Normal heart sounds.  Pulmonary:     Effort: Pulmonary effort is normal.     Breath sounds: Normal breath sounds.  Musculoskeletal:     Cervical back: Normal range of motion.  Skin:    General: Skin is warm.  Neurological:     General: No focal deficit present.     Mental Status: She is alert.  Psychiatric:        Mood and Affect: Mood normal.        Behavior: Behavior normal.         Assessment And Plan:   Assessment & Plan Essential hypertension Chronic, encouraged improved compliance with medication. Hypertension with recent spikes, non-adherence  to amlodipine , now daily. Symptoms include weakness and fatigue. Differential includes panic attacks, which she disputes. - Continue amlodipine  daily. - Follow low sodium diet.  - Ordered blood work for liver, kidney function, cholesterol, prediabetes, thyroid, vitamin D . - Scheduled follow-up in three months. Shortness of breath Likely due to deconditioning. Family history of heart disease. No chest pain except during recent hypertension episode. Interested in exercise but concerned about heart issues. - Ordered echocardiogram - Ordered genetic predisposition test for heart disease. - Discussed $99 calcium score test. - Continue aspirin 325 mg daily Abnormal glucose Previous labs reviewed, her A1c has been elevated in the past. I will check an A1c today. Reminded to avoid refined sugars including sugary drinks/foods and processed meats including bacon, sausages and deli meats.   Vitamin D  deficiency I WILL CHECK A VIT D LEVEL AND SUPPLEMENT AS NEEDED.  ALSO ENCOURAGED TO SPEND 15 MINUTES IN THE SUN DAILY.  Family history of heart disease I will check lipoprotein (a).  Thalassemia, unspecified type Per patient history. General health maintenance Discussion of lifestyle changes including weight loss and increased physical activity. Reduced work hours and dietary changes attempted. -  Encouraged weight loss and regular exercise. - Discussed dietary modifications to reduce unhealthy food intake.   Orders Placed This Encounter  Procedures   CBC   CMP14+EGFR   Lipid panel   Hemoglobin A1c   Vitamin D  (25 hydroxy)   Lipoprotein A (LPA)   Iron  and TIBC   Ferritin   Specimen status report   ECHOCARDIOGRAM COMPLETE   Return for 3 month PHYS W JM w/ pap smear.  Patient was given opportunity to ask questions. Patient verbalized understanding of the plan and was able to repeat key elements of the plan. All questions were answered to their satisfaction.   I, Charlene LOISE Slocumb, MD, have reviewed all documentation for this visit. The documentation on 04/16/2024 for the exam, diagnosis, procedures, and orders are all accurate and complete.   IF YOU HAVE BEEN REFERRED TO A SPECIALIST, IT MAY TAKE 1-2 WEEKS TO SCHEDULE/PROCESS THE REFERRAL. IF YOU HAVE NOT HEARD FROM US /SPECIALIST IN TWO WEEKS, PLEASE GIVE US  A CALL AT [phone] X [phone].

## 2024-04-16 NOTE — Assessment & Plan Note (Addendum)
 Chronic, encouraged improved compliance with medication. Hypertension with recent spikes, non-adherence to amlodipine , now daily. Symptoms include weakness and fatigue. Differential includes panic attacks, which she disputes. - Continue amlodipine  daily. - Follow low sodium diet.  - Ordered blood work for liver, kidney function, cholesterol, prediabetes, thyroid, vitamin D . - Scheduled follow-up in three months.

## 2024-04-18 LAB — CBC
Hematocrit: 34.2 % (ref 34.0–46.6)
Hemoglobin: 10 g/dL — ABNORMAL LOW (ref 11.1–15.9)
MCH: 17.9 pg — ABNORMAL LOW (ref 26.6–33.0)
MCHC: 29.2 g/dL — ABNORMAL LOW (ref 31.5–35.7)
MCV: 61 fL — ABNORMAL LOW (ref 79–97)
Platelets: 421 x10E3/uL (ref 150–450)
RBC: 5.59 x10E6/uL — ABNORMAL HIGH (ref 3.77–5.28)
RDW: 20.5 % — ABNORMAL HIGH (ref 11.7–15.4)
WBC: 8.1 x10E3/uL (ref 3.4–10.8)

## 2024-04-18 LAB — LIPID PANEL
Chol/HDL Ratio: 2.7 ratio (ref 0.0–4.4)
Cholesterol, Total: 174 mg/dL (ref 100–199)
HDL: 65 mg/dL
LDL Chol Calc (NIH): 96 mg/dL (ref 0–99)
Triglycerides: 71 mg/dL (ref 0–149)
VLDL Cholesterol Cal: 13 mg/dL (ref 5–40)

## 2024-04-18 LAB — CMP14+EGFR
ALT: 7 IU/L (ref 0–32)
AST: 12 IU/L (ref 0–40)
Albumin: 4.4 g/dL (ref 3.8–4.9)
Alkaline Phosphatase: 83 IU/L (ref 49–135)
BUN/Creatinine Ratio: 14 (ref 9–23)
BUN: 13 mg/dL (ref 6–24)
Bilirubin Total: 0.5 mg/dL (ref 0.0–1.2)
CO2: 22 mmol/L (ref 20–29)
Calcium: 9.2 mg/dL (ref 8.7–10.2)
Chloride: 103 mmol/L (ref 96–106)
Creatinine, Ser: 0.94 mg/dL (ref 0.57–1.00)
Globulin, Total: 3 g/dL (ref 1.5–4.5)
Glucose: 87 mg/dL (ref 70–99)
Potassium: 4.7 mmol/L (ref 3.5–5.2)
Sodium: 138 mmol/L (ref 134–144)
Total Protein: 7.4 g/dL (ref 6.0–8.5)
eGFR: 72 mL/min/1.73

## 2024-04-18 LAB — LIPOPROTEIN A (LPA): Lipoprotein (a): <8.4 nmol/L

## 2024-04-18 LAB — VITAMIN D 25 HYDROXY (VIT D DEFICIENCY, FRACTURES): Vit D, 25-Hydroxy: 18.9 ng/mL — ABNORMAL LOW (ref 30.0–100.0)

## 2024-04-18 LAB — HEMOGLOBIN A1C
Est. average glucose Bld gHb Est-mCnc: 120 mg/dL
Hgb A1c MFr Bld: 5.8 % — ABNORMAL HIGH (ref 4.8–5.6)

## 2024-04-19 ENCOUNTER — Ambulatory Visit: Payer: Self-pay | Admitting: Internal Medicine

## 2024-04-23 ENCOUNTER — Other Ambulatory Visit: Payer: Self-pay | Admitting: Nurse Practitioner

## 2024-04-23 DIAGNOSIS — E559 Vitamin D deficiency, unspecified: Secondary | ICD-10-CM

## 2024-04-23 DIAGNOSIS — I10 Essential (primary) hypertension: Secondary | ICD-10-CM

## 2024-04-23 LAB — IRON AND TIBC
Iron Saturation: 13 % — ABNORMAL LOW (ref 15–55)
Iron: 48 ug/dL (ref 27–159)
Total Iron Binding Capacity: 368 ug/dL (ref 250–450)
UIBC: 320 ug/dL (ref 131–425)

## 2024-04-23 LAB — FERRITIN: Ferritin: 10 ng/mL — ABNORMAL LOW (ref 15–150)

## 2024-04-23 LAB — SPECIMEN STATUS REPORT

## 2024-04-23 NOTE — Assessment & Plan Note (Addendum)
Per patient history.

## 2024-05-30 ENCOUNTER — Ambulatory Visit (HOSPITAL_COMMUNITY)
Admission: RE | Admit: 2024-05-30 | Discharge: 2024-05-30 | Disposition: A | Source: Ambulatory Visit | Attending: Internal Medicine | Admitting: Internal Medicine

## 2024-05-30 DIAGNOSIS — R0602 Shortness of breath: Secondary | ICD-10-CM

## 2024-05-30 DIAGNOSIS — R0600 Dyspnea, unspecified: Secondary | ICD-10-CM

## 2024-05-30 LAB — ECHOCARDIOGRAM COMPLETE
Area-P 1/2: 3.23 cm2
S' Lateral: 2.2 cm

## 2024-07-23 ENCOUNTER — Encounter: Payer: Self-pay | Admitting: Nurse Practitioner
